# Patient Record
Sex: Female | Born: 1970 | Race: Black or African American | Hispanic: No | Marital: Single | Smoking: Never smoker
Health system: Southern US, Community
[De-identification: ages and names within clinical notes are randomized; demographics above are authoritative.]

## PROBLEM LIST (undated history)

## (undated) DIAGNOSIS — D649 Anemia, unspecified: Secondary | ICD-10-CM

## (undated) DIAGNOSIS — E785 Hyperlipidemia, unspecified: Secondary | ICD-10-CM

## (undated) DIAGNOSIS — K219 Gastro-esophageal reflux disease without esophagitis: Secondary | ICD-10-CM

## (undated) DIAGNOSIS — I10 Essential (primary) hypertension: Secondary | ICD-10-CM

## (undated) DIAGNOSIS — R000 Tachycardia, unspecified: Secondary | ICD-10-CM

## (undated) DIAGNOSIS — F32A Depression, unspecified: Secondary | ICD-10-CM

## (undated) DIAGNOSIS — F419 Anxiety disorder, unspecified: Secondary | ICD-10-CM

## (undated) DIAGNOSIS — K589 Irritable bowel syndrome without diarrhea: Secondary | ICD-10-CM

## (undated) DIAGNOSIS — F329 Major depressive disorder, single episode, unspecified: Secondary | ICD-10-CM

## (undated) HISTORY — DX: Anxiety disorder, unspecified: F41.9

## (undated) HISTORY — PX: UPPER GASTROINTESTINAL ENDOSCOPY: SHX188

## (undated) HISTORY — DX: Hyperlipidemia, unspecified: E78.5

## (undated) HISTORY — DX: Essential (primary) hypertension: I10

## (undated) HISTORY — PX: BREAST LUMPECTOMY: SHX2

## (undated) HISTORY — PX: COLONOSCOPY: SHX174

## (undated) HISTORY — PX: BUNIONECTOMY: SHX129

## (undated) HISTORY — DX: Anemia, unspecified: D64.9

## (undated) HISTORY — DX: Irritable bowel syndrome, unspecified: K58.9

## (undated) HISTORY — DX: Gastro-esophageal reflux disease without esophagitis: K21.9

## (undated) HISTORY — DX: Tachycardia, unspecified: R00.0

## (undated) HISTORY — DX: Depression, unspecified: F32.A

---
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
## 2011-11-17 ENCOUNTER — Ambulatory Visit: Payer: Self-pay | Admitting: General Practice

## 2017-08-15 ENCOUNTER — Encounter: Payer: Self-pay | Admitting: Gastroenterology

## 2017-08-22 ENCOUNTER — Ambulatory Visit: Payer: Self-pay | Admitting: Gastroenterology

## 2017-09-01 ENCOUNTER — Ambulatory Visit: Payer: Self-pay | Admitting: Gastroenterology

## 2017-10-19 ENCOUNTER — Encounter (INDEPENDENT_AMBULATORY_CARE_PROVIDER_SITE_OTHER): Payer: Self-pay

## 2017-10-19 ENCOUNTER — Ambulatory Visit (INDEPENDENT_AMBULATORY_CARE_PROVIDER_SITE_OTHER): Payer: Self-pay | Admitting: Gastroenterology

## 2017-10-19 ENCOUNTER — Encounter: Payer: Self-pay | Admitting: Gastroenterology

## 2017-10-19 VITALS — BP 134/98 | HR 78 | Ht 67.5 in | Wt 183.4 lb

## 2017-10-19 DIAGNOSIS — K529 Noninfective gastroenteritis and colitis, unspecified: Secondary | ICD-10-CM

## 2017-10-19 DIAGNOSIS — R1084 Generalized abdominal pain: Secondary | ICD-10-CM

## 2017-10-19 MED ORDER — CHOLESTYRAMINE LIGHT 4 G PO PACK: 4.0000 g | PACK | Freq: Two times a day (BID) | ORAL | 2 refills | Status: DC

## 2017-10-19 NOTE — Progress Notes (Addendum)
[facility] Consult Note:  History: Heather Marks 10/19/2017  Referring physician: Phylliss Bob, MD  Reason for consult/chief complaint: Abdominal Pain (generalized pain x 1 year.  Off/on diarrhea x one year.  Had colonoscopy  2018)   Subjective  HPI:  This is a 47 year old woman self-referred for another opinion on chronic digestive symptoms.  She reports that over a year ago she developed generalized bloated and crampy abdominal pain along with diarrhea.  She will have anywhere from 4-9 BMs per day with some urgency.  Some days she feels well with no abdominal pain or diarrhea, but she feels sick more days than not.  The stool is soft to loose, nonbloody and the pain is generalized with no clear triggers or relieving factors.  She saw Dr.Pandya in [place], and we do not have the records today.  We had made a request for them and I will review them when available. Tyreanna recalls having had a colonoscopy, but does not know of any biopsies were taken.  She recalls a stool study for fecal fat and possibly celiac testing.  She believes she was tested for H. pylori, and may have had a HIDA scan going further back.  She did not have improvement with Bentyl or much improvement with Viberzi 100 mg daily.  She does notice some worsening of symptoms with stress, but that has not been a consistent trigger. Annelise denies nausea, vomiting, early satiety, dysphagia, and recently has about a 10 pound purposeful weight loss.  ROS:  Review of Systems  Constitutional: Negative for appetite change and unexpected weight change.  HENT: Negative for mouth sores and voice change.   Eyes: Negative for pain and redness.  Respiratory: Negative for cough and shortness of breath.   Cardiovascular: Negative for chest pain and palpitations.  Genitourinary: Negative for dysuria and hematuria.  Musculoskeletal: Negative for arthralgias and myalgias.  Skin: Negative for pallor and rash.    Neurological: Negative for weakness and headaches.  Hematological: Negative for adenopathy.   She denies any history of mood disorder or history of abuse  Past Medical History: Past Medical History:  Diagnosis Date  . Anemia   . GERD (gastroesophageal reflux disease)   . Hypertension   . IBS (irritable bowel syndrome)   . Tachycardia      Past Surgical History: Past Surgical History:  Procedure Laterality Date  . BREAST LUMPECTOMY Bilateral   . BUNIONECTOMY Bilateral      Family History: Family History  Problem Relation Age of Onset  . Diabetes Mother   . Hypertension Mother   . Thyroid disease Father   . Other Maternal Grandmother        sepsis  . Other Maternal Grandfather        gun shot wound  . Heart disease Paternal Grandfather   . Colon cancer Neg Hx   . Liver cancer Neg Hx     Social History: Social History   Socioeconomic History  . Marital status: Single    Spouse name: None  . Number of children: 1  . Years of education: None  . Highest education level: None  Social Needs  . Financial resource strain: None  . Food insecurity - worry: None  . Food insecurity - inability: None  . Transportation needs - medical: None  . Transportation needs - non-medical: None  Occupational History  . Occupation: Charity fundraiser    Comment: [employer]  Tobacco Use  . Smoking status: Never Smoker  .  Smokeless tobacco: Never Used  Substance and Sexual Activity  . Alcohol use: Yes    Comment: socially  . Drug use: No  . Sexual activity: None  Other Topics Concern  . None  Social History Narrative  . None   Nurse at a mental health facility  Allergies: Allergies  Allergen Reactions  . Macrodantin [Nitrofurantoin] Swelling    Swelling of lips    Outpatient Meds: Current Outpatient Medications  Medication Sig Dispense Refill  . amLODipine (NORVASC) 10 MG tablet Take 10 mg by mouth daily.    . Vitamin D, Ergocalciferol, (DRISDOL) 50000 units CAPS  capsule Take 50,000 Units by mouth every 7 (seven) days.    . cholestyramine light (PREVALITE) 4 g packet Take 1 packet (4 g total) by mouth 2 (two) times daily. Take this medicine at least 2 hours away from any other medicines 60 packet 2   No current facility-administered medications for this visit.       ___________________________________________________________________ Objective   Exam:  BP (!) 134/98   Pulse 78   Ht 5' 7.5" (1.715 m)   Wt 183 lb 6 oz (83.2 kg)   BMI 28.30 kg/m    General: this is a(n) well-appearing woman  Eyes: sclera anicteric, no redness  ENT: oral mucosa moist without lesions, no cervical or supraclavicular lymphadenopathy, good dentition  CV: RRR without murmur, S1/S2, no JVD, no peripheral edema  Resp: clear to auscultation bilaterally, normal RR and effort noted  GI: soft, no tenderness, with active bowel sounds. No guarding or palpable organomegaly noted.  Skin; warm and dry, no rash or jaundice noted  Neuro: awake, alert and oriented x 3. Normal gross motor function and fluent speech  Labs: No data for review today   Assessment: Encounter Diagnoses  Name Primary?  . Generalized abdominal pain Yes  . Chronic diarrhea     Her symptoms do sound typical for IBS.  I would like to get records to see what celiac testing was done, and if colon biopsies were taken to rule out microscopic colitis.  She does not appear to have risk factors for chronic pancreatitis, and has not had unexpected weight loss.  He does not have risk factors for bacterial overgrowth.  She still has her gallbladder, but many patients with IBS have bile acid diarrhea.  So I have given her a trial of cholestyramine 4 g twice daily to be taken 2 hours away from other medicines.  We will make an attempt to gather her records and review them when available and contact her with a further plan for any other testing and follow-up.   Thank you for the courtesy of this consult.   Please call me with any questions or concerns.  Charlie PitterHenry L Danis III  CC: Phylliss BobZakhary, Bosh, MD   Addendum 01/05/18:  [facility]  Few office notes back to 1027 Tried bentyl, omeprazole, rifaximin (11/2016) C diff and fecal elastase ordered, no result in records. Viberzi helped diarrhea but caused stomach pain Colonoscopy 12/2016 reportedly to TI (but then no description of TI).  Single cecal Bx neg Sigmoidoscopy reportedly done 2003 (no report included), EGD 2001 (no report included) No celiac testing found  - HD

## 2017-10-19 NOTE — Patient Instructions (Signed)
If you are age 46 or older, your body mass index should be between 23-30. Your Body mass index is 28.3 kg/m. If this is out of the aforementioned range listed, please consider follow up with your Primary Care Provider.  If you are age 46 or younger, your body mass index should be between 19-25. Your Body mass index is 28.3 kg/m. If this is out of the aformentioned range listed, please consider follow up with your Primary Care Provider.   We will call you after we get records. If you don't hear from us in a week or so please stop by Dr Eliane DecreePatel's office and collect your records. 3  Thank you for choosing [facility]  Dr Amada JupiterHenry Danis III

## 2017-12-25 ENCOUNTER — Telehealth: Payer: Self-pay | Admitting: Gastroenterology

## 2017-12-25 NOTE — Telephone Encounter (Signed)
Left a detailed message that we have not recieved the records yet from Dr Allena Katz.

## 2018-01-05 ENCOUNTER — Telehealth: Payer: Self-pay | Admitting: Gastroenterology

## 2018-01-05 NOTE — Telephone Encounter (Signed)
Records finally received from [facility] and reviewed. They were not as informative as I would have liked.   Continue the cholestyramine if it is helping , and arrange clinic visit with me.

## 2018-01-08 NOTE — Telephone Encounter (Signed)
Patient called back, let her know that Dr. Myrtie Marks would like to schedule an office follow up visit. Patient was unable to do this today and would call back when she had her calendar.

## 2018-01-08 NOTE — Telephone Encounter (Signed)
Left message for patient to call back. She will need to have a follow up clinic visit.

## 2018-03-20 ENCOUNTER — Ambulatory Visit: Payer: BLUE CROSS/BLUE SHIELD | Admitting: Gastroenterology

## 2018-03-20 ENCOUNTER — Encounter (INDEPENDENT_AMBULATORY_CARE_PROVIDER_SITE_OTHER): Payer: Self-pay

## 2018-03-20 ENCOUNTER — Encounter: Payer: Self-pay | Admitting: Gastroenterology

## 2018-03-20 VITALS — BP 112/68 | HR 74 | Ht 67.0 in | Wt 188.1 lb

## 2018-03-20 DIAGNOSIS — K529 Noninfective gastroenteritis and colitis, unspecified: Secondary | ICD-10-CM | POA: Diagnosis not present

## 2018-03-20 DIAGNOSIS — R1084 Generalized abdominal pain: Secondary | ICD-10-CM | POA: Diagnosis not present

## 2018-03-20 NOTE — Patient Instructions (Addendum)
If you are age 46 or older, your body mass index should be between 23-30. Your Body mass index is 29.46 kg/m. If this is out of the aforementioned range listed, please consider follow up with your Primary Care Provider.  If you are age 46 or younger, your body mass index should be between 19-25. Your Body mass index is 29.46 kg/m. If this is out of the aformentioned range listed, please consider follow up with your Primary Care Provider.     Food Guidelines for a sensitive stomach  Many people have difficulty digesting certain foods, causing a variety of distressing and embarrassing symptoms such as abdominal pain, bloating and gas.  These foods may need to be avoided or consumed in small amounts.  Here are some tips that might be helpful for you.  1.   Lactose intolerance is the difficulty or complete inability to digest lactose, the natural sugar in milk and anything made from milk.  This condition is harmless, common, and can begin any time during life.  Some people can digest a modest amount of lactose while others cannot tolerate any.  Also, not all dairy products contain equal amounts of lactose.  For example, hard cheeses such as parmesan have less lactose than soft cheeses such as cheddar.  Yogurt has less lactose than milk or cheese.  Many packaged foods (even many brands of bread) have milk, so read ingredient lists carefully.  It is difficult to test for lactose intolerance, so just try avoiding lactose as much as possible for a week and see what happens with your symptoms.  If you seem to be lactose intolerant, the best plan is to avoid it (but make sure you get calcium from another source).  The next best thing is to use lactase enzyme supplements, available over the counter everywhere.  Just know that many lactose intolerant people need to take several tablets with each serving of dairy to avoid symptoms.  Lastly, a lot of restaurant food is made with milk or butter.  Many are things you  might not suspect, such as mashed potatoes, rice and pasta (cooked with butter) and "grilled" items.  If you are lactose intolerant, it never hurts to ask your server what has milk or butter.  2.   Fiber is an important part of your diet, but not all fiber is well-tolerated.  Insoluble fiber such as bran is often consumed by normal gut bacteria and converted into gas.  Soluble fiber such as oats, squash, carrots and green beans are typically tolerated better.  3.   Some types of carbohydrates can be poorly digested.  Examples include: fructose (apples, cherries, pears, raisins and other dried fruits), fructans (onions, zucchini, large amounts of wheat), sorbitol/mannitol/xylitol and sucralose/Splenda (common artificial sweeteners), and raffinose (lentils, broccoli, cabbage, asparagus, brussel sprouts, many types of beans).  Do a Programmer, multimediaweb search for National CityFODMAP diet and you will find helpful information. Beano, a dietary supplement, will often help with raffinose-containing foods.  As with lactase tablets, you may need several per serving.  4.   Whenever possible, avoid processed food&meats and chemical additives.  High fructose corn syrup, a common sweetener, may be difficult to digest.  Eggs and soy (comes from the soybean, and added to many foods now) are other common bloating/gassy foods.  - Dr. Sherlynn CarbonHenry Danis [facility]    ___________________________________________________________________   Please call us soon with the dose of the rifaximin you have at home so we can give recommendation on dosing.

## 2018-03-20 NOTE — Progress Notes (Signed)
[facility] Progress Note  Chief Complaint: Chronic abdominal pain and diarrhea  Subjective  History:  This is a 47 year old woman I saw when she self-referred for another opinion on chronic abdominal pain and diarrhea in March of this year.  She had previously spent seen by [place]and[place] in [place].  Records were received and reviewed after that office note and were put as an addendum to the note of the March 2019 office visit.  This is my own summary of those records: Few office notes back to 1027 Tried bentyl, omeprazole, rifaximin (11/2016) C diff and fecal elastase ordered, no result in records. Viberzi helped diarrhea but caused stomach pain Colonoscopy 12/2016 reportedly to TI (but then no description of TI).  Single cecal Bx neg Sigmoidoscopy reportedly done 2003 (no report included), EGD 2001 (no report included) No celiac testing found  Latrish reports that the cholestyramine decrease his stool frequency from 5 or 6 a day to 1 or 2, and they are usually soft.  There is no rectal bleeding.  If the diarrhea is decreased, so too is the abdominal pain.  Her symptoms are always worse around her menstrual cycle.  Stress is also a trigger.  She still has bloating and gas with audible rumbling that is not much improved.  She finds the cholestyramine powder difficult to take.  ROS: Cardiovascular:  no chest pain Respiratory: no dyspnea Weight stable The patient's Past Medical, Family and Social History were reviewed and are on file in the EMR.  Objective:  Med list reviewed  Current Outpatient Medications:  .  amLODipine (NORVASC) 10 MG tablet, Take 10 mg by mouth daily., Disp: , Rfl:  .  cholestyramine light (PREVALITE) 4 g packet, Take 1 packet (4 g total) by mouth 2 (two) times daily. Take this medicine at least 2 hours away from any other medicines, Disp: 60 packet, Rfl: 2 .  Vitamin D, Ergocalciferol, (DRISDOL) 50000 units CAPS capsule, Take 50,000 Units by mouth every 7  (seven) days., Disp: , Rfl:    Vital signs in last 24 hrs: Vitals:   03/20/18 0949  BP: 112/68  Pulse: 74    Physical Exam  Well-appearing  HEENT: sclera anicteric, oral mucosa moist without lesions  Neck: supple, no thyromegaly, JVD or lymphadenopathy  Cardiac: RRR without murmurs, S1S2 heard, no peripheral edema  Pulm: clear to auscultation bilaterally, normal RR and effort noted  Abdomen: soft, no tenderness, with active bowel sounds. No guarding or palpable hepatosplenomegaly.  Skin; warm and dry, no jaundice or rash  Previous records as summarized  @ASSESSMENTPLANBEGIN @ Assessment: Encounter Diagnoses  Name Primary?  . Generalized abdominal pain Yes  . Chronic diarrhea     I suspect she has IBS, no risk factors for pancreatic insufficiency, less likely bacterial overgrowth.  She reports that in fact she was prescribed rifaximin, and even filled the prescription, but was then told not to take it.  I think an empiric trial of rifaximin is reasonable.  She has 42 tablets of the 550 mg dose.  I advised one tablet twice daily x 14 days.  I would like to then hear back from her on whether or not it was helpful for relief of symptoms.  If not, then I will change the cholestyramine powder to a similar oral medicine such as colestipol or colesevalam.  Written dietary advice regarding bloating and gas were also given.  Total time 20 minutes, over half spent face-to-face with patient in counseling and coordination of care.  Nelida Meuse III

## 2018-03-24 ENCOUNTER — Other Ambulatory Visit: Payer: Self-pay | Admitting: Gastroenterology

## 2018-04-24 ENCOUNTER — Other Ambulatory Visit: Payer: Self-pay | Admitting: Gastroenterology

## 2018-10-24 ENCOUNTER — Telehealth: Payer: Self-pay

## 2018-10-24 NOTE — Telephone Encounter (Signed)
Covid-19 travel screening questions  Have you traveled in the last 14 days? If yes where?  Do you now or have you had a fever in the last 14 days?  Do you have any respiratory symptoms of shortness of breath or cough now or in the last 14 days?  Do you have a medical history of Congestive Heart Failure? N/A  Do you have a medical history of lung disease? N/A  Do you have any family members or close contacts with diagnosed or suspected Covid-19?  Left message for pt to call and reschedule her appt if she answers yes to any of the above questions.

## 2018-10-25 ENCOUNTER — Ambulatory Visit: Payer: BLUE CROSS/BLUE SHIELD | Admitting: Gastroenterology

## 2018-10-25 ENCOUNTER — Other Ambulatory Visit: Payer: Self-pay

## 2018-10-25 ENCOUNTER — Encounter: Payer: Self-pay | Admitting: Gastroenterology

## 2018-10-25 VITALS — BP 112/80 | HR 58 | Temp 97.7°F | Ht 67.0 in | Wt 189.0 lb

## 2018-10-25 DIAGNOSIS — R140 Abdominal distension (gaseous): Secondary | ICD-10-CM | POA: Diagnosis not present

## 2018-10-25 DIAGNOSIS — K529 Noninfective gastroenteritis and colitis, unspecified: Secondary | ICD-10-CM | POA: Diagnosis not present

## 2018-10-25 DIAGNOSIS — R143 Flatulence: Secondary | ICD-10-CM

## 2018-10-25 DIAGNOSIS — R1010 Upper abdominal pain, unspecified: Secondary | ICD-10-CM

## 2018-10-25 MED ORDER — COLESEVELAM HCL 625 MG PO TABS: 625.0000 mg | ORAL_TABLET | Freq: Two times a day (BID) | ORAL | 0 refills | Status: DC

## 2018-10-25 NOTE — Progress Notes (Signed)
Heather Marks Progress Note  Chief Complaint: Upper abdominal pain  Subjective  History: Seen twice last years another opinion, self-referred after prior care in [place].  Extensive work-up over many years for chronic abdominal pain, diarrhea, bloating and gas.  That is summarized in the previous note. No improvement with omeprazole, bentyl, Viberzi.  She also had reported collections of gallbladder testing, no documentation of that in the available [facility] records.  At last visit here August 2019, underwent empiric trial of rifaximin for SIBO.  She had previously had improvement on cholestyramine, but found the powder difficult to take.  We did not hear back from her after that appointment with an update on symptoms.  She tells me today that there was no improvement in the bloating and gas or diarrhea with the trial of rifaximin.  Heather Marks reports that in the last several months she has had frequent episodes of upper abdominal pain that starts in the midline and radiates in a bandlike fashion and around to the back.  It is difficult to tell if there have been clear or consistent triggers, but it seems most often to happen in the evening, and she is taken to sitting up partially for sleep.  The episodes last at least 5 to 10 minutes, and she has been taking liquid ibuprofen for relief.  Sometimes the pain radiates down to the lower abdomen.  She is also having intermittent pyrosis for which she is on OTC omeprazole.  She denies dysphagia, odynophagia, nausea, vomiting or weight loss. The cholestyramine helped decrease frequency of BMs, she is usually down to 2/day but they are semi-formed.  She has not taken the cholestyramine all that often because she finds it cumbersome.  ROS: Cardiovascular:  no chest pain Respiratory: no dyspnea Remainder of systems negative except as above  The patient's Past Medical, Family and Social History were reviewed and are on file in the EMR.  Objective:   Med list reviewed  Current Outpatient Medications:  .  amLODipine (NORVASC) 10 MG tablet, Take 10 mg by mouth daily., Disp: , Rfl:  .  omeprazole (PRILOSEC) 20 MG capsule, Take 20 mg by mouth daily., Disp: , Rfl:  .  PREVALITE 4 g packet, MIX 1 PACKET WITH LIQUID AND DRINK TWICE A DAY AT LEAST 2 HRS BEFORE OR AFTER ANY OTHER MEDICINES, Disp: 60 packet, Rfl: 2 .  colesevelam (WELCHOL) 625 MG tablet, Take 1 tablet (625 mg total) by mouth 2 (two) times daily with a meal for 14 days., Disp: 28 tablet, Rfl: 0   Vital signs in last 24 hrs: Vitals:   10/25/18 0846  BP: 112/80  Pulse: (!) 58  Temp: 97.7 F (36.5 C)    Physical Exam   HEENT: sclera anicteric, oral mucosa moist without lesions  Neck: supple, no thyromegaly, JVD or lymphadenopathy  Cardiac: RRR without murmurs, S1S2 heard, no peripheral edema  Pulm: clear to auscultation bilaterally, normal RR and effort noted  Abdomen: soft, no tenderness, with active bowel sounds. No guarding or palpable hepatosplenomegaly.  Skin; warm and dry, no jaundice or rash     @ASSESSMENTPLANBEGIN @ Assessment: Encounter Diagnoses  Name Primary?  Marland Kitchen Upper abdominal pain Yes  . Chronic diarrhea   . Abdominal bloating   . Flatulence     Upper abdominal pain, possibly biliary colic.  It is difficult to tell if it is similar to pain that she has previously had requiring further work-up in [place].  Even if so, it has been escalating  in recent months.  Plan: Right upper quadrant ultrasound Change cholestyramine to WelChol 625 mg 1-2 times daily for suspected bile acid diarrhea.   Total time 25 minutes, over half spent face-to-face with patient in counseling and coordination of care.   Charlie Pitter III

## 2018-10-25 NOTE — Patient Instructions (Addendum)
If you are age 47 or older, your body mass index should be between 23-30. Your Body mass index is 29.6 kg/m. If this is out of the aforementioned range listed, please consider follow up with your Primary Care Provider.  If you are age 66 or younger, your body mass index should be between 19-25. Your Body mass index is 29.6 kg/m. If this is out of the aformentioned range listed, please consider follow up with your Primary Care Provider.   You have been scheduled for an abdominal ultrasound at [facility] (1st floor of hospital) on 11-22-2018 at 10:30am. Please arrive 15 minutes prior to your appointment for registration. Make certain not to have anything to eat or drink 6 hours prior to your appointment. Should you need to reschedule your appointment, please contact radiology at [phone]. This test typically takes about 30 minutes to perform.  To help prevent the possible spread of infection to our patients, communities, and staff; we will be implementing the following measures:  Please only allow one visitor/family member to accompany you to any upcoming appointments with [facility]. If you have any concerns about this please contact our office to discuss prior to the appointment.   It was a pleasure to see you today!  Dr. Myrtie Neither

## 2018-11-22 ENCOUNTER — Ambulatory Visit (HOSPITAL_COMMUNITY): Admission: RE | Admit: 2018-11-22 | Payer: BLUE CROSS/BLUE SHIELD | Source: Ambulatory Visit

## 2018-11-22 ENCOUNTER — Other Ambulatory Visit: Payer: Self-pay | Admitting: Gastroenterology

## 2018-11-23 MED ORDER — CHOLESTYRAMINE LIGHT 4 G PO PACK: PACK | ORAL | 2 refills | Status: DC

## 2018-11-23 NOTE — Telephone Encounter (Signed)
Refilled her cholestyramine light as requested.

## 2018-11-27 ENCOUNTER — Other Ambulatory Visit: Payer: Self-pay | Admitting: Gastroenterology

## 2018-12-23 ENCOUNTER — Other Ambulatory Visit: Payer: Self-pay | Admitting: Gastroenterology

## 2019-01-15 ENCOUNTER — Other Ambulatory Visit: Payer: Self-pay | Admitting: Gastroenterology

## 2019-04-14 ENCOUNTER — Other Ambulatory Visit: Payer: Self-pay | Admitting: Gastroenterology

## 2019-09-10 ENCOUNTER — Other Ambulatory Visit: Payer: Self-pay | Admitting: Gastroenterology

## 2019-09-10 NOTE — Telephone Encounter (Signed)
I refilled this patient's Welchol.  Last seen March 2020  Please arrange a telemedicine visit for sometime next month (lives in [place], [place])

## 2019-09-11 NOTE — Telephone Encounter (Signed)
Left a detailed office visit to call and schedule a yearly follow up. Can be virtual per Dr Myrtie Neither as she live in [place].

## 2019-09-11 NOTE — Telephone Encounter (Signed)
Follow up 10-08-2019

## 2019-09-27 ENCOUNTER — Telehealth: Payer: Self-pay | Admitting: Gastroenterology

## 2019-09-27 NOTE — Telephone Encounter (Signed)
I spoke with the pt and she tells me that she continues to have diarrhea.  She has an appt on 3/2 but would like advice to make it thru the weekend. She tells me that she has tried imodium and that helps but was not sure if she could take it long term.  I advised that she can take the imodium until her appt on Tuesday and Dr Myrtie Neither would discuss further. The pt has been advised of the information and verbalized understanding.

## 2019-09-27 NOTE — Telephone Encounter (Signed)
Pt reported that she has been experiencing diarrhea and would like to know what she can do until her appt 10/08/19.

## 2019-10-08 ENCOUNTER — Other Ambulatory Visit: Payer: Self-pay

## 2019-10-08 ENCOUNTER — Encounter: Payer: Self-pay | Admitting: Gastroenterology

## 2019-10-08 ENCOUNTER — Ambulatory Visit (INDEPENDENT_AMBULATORY_CARE_PROVIDER_SITE_OTHER): Payer: Self-pay | Admitting: Gastroenterology

## 2019-10-08 VITALS — BP 130/70 | HR 76 | Temp 98.8°F | Ht 67.0 in | Wt 195.0 lb

## 2019-10-08 DIAGNOSIS — R140 Abdominal distension (gaseous): Secondary | ICD-10-CM

## 2019-10-08 DIAGNOSIS — K529 Noninfective gastroenteritis and colitis, unspecified: Secondary | ICD-10-CM

## 2019-10-08 DIAGNOSIS — R1013 Epigastric pain: Secondary | ICD-10-CM

## 2019-10-08 NOTE — Progress Notes (Addendum)
[facility] Progress Note  Chief Complaint: Chronic diarrhea  Subjective  History: Years of chronic abdominal pain, more so in the upper abdomen.  Extensive work-up in [place] prior to seeing me in 2019.  Colonoscopy to the cecum (in [facility] May 2018 by Dr. Allena Katz) reportedly normal (report on file)-biopsies negative for microscopic colitis.  Last office visit March 2020, pain seem to be getting more frequent, so my plan was to schedule an ultrasound.  She does not appear to have had that done, probably due to Covid restrictions at that time. Intermittent diarrhea, multiple previous meds had not helped (outlined in my last office note).  Questran would seem to help at times, but she found it difficult to use.  At last visit I prescribed a trial of WelChol to see if that was easier.  Previous trial of rifaximin did not seem to help.  She called the office recently complaining of more diarrhea.  Bothered by generalized abdominal bloating and lots of "noise".  It seems like things are "angry and active".  She still at times has more focal epigastric pain that tends to occur in the evening.  Rare nausea, no vomiting.  Appetite generally good, weight stable.  Stool loose most days, Questran twice daily helps, Welchol not as helpful.   ROS: Cardiovascular:  no chest pain Respiratory: no dyspnea  The patient's Past Medical, Family and Social History were reviewed and are on file in the EMR.  Objective:  Med list reviewed  Current Outpatient Medications:  .  amLODipine (NORVASC) 10 MG tablet, Take 10 mg by mouth daily., Disp: , Rfl:  .  cholestyramine light (PREVALITE) 4 g packet, MIX 1 PACKET WITH LIQUID AND DRINK TWICE A DAY AT LEAST 2 HRS BEFORE OR AFTER ANY OTHER MEDICINES, Disp: 60 packet, Rfl: 2 .  colesevelam (WELCHOL) 625 MG tablet, TAKE 1 TABLET BY MOUTH TWICE A DAY WITH MEALS, Disp: 90 tablet, Rfl: 0 .  omeprazole (PRILOSEC) 20 MG capsule, Take 20 mg by mouth daily., Disp: ,  Rfl:  .  Probiotic Product (PROBIOTIC PO), Take 1 capsule by mouth daily. [employer] brand, Disp: , Rfl:  .  sertraline (ZOLOFT) 25 MG tablet, Take 25 mg by mouth daily., Disp: , Rfl:    Vital signs in last 24 hrs: Vitals:   10/08/19 1503  BP: 130/70  Pulse: 76  Temp: 98.8 F (37.1 C)    Physical Exam  Well-appearing  HEENT: sclera anicteric, oral mucosa moist without lesions  Neck: supple, no thyromegaly, JVD or lymphadenopathy  Cardiac: RRR without murmurs, S1S2 heard, no peripheral edema  Pulm: clear to auscultation bilaterally, normal RR and effort noted  Abdomen: soft, no tenderness, with active bowel sounds. No guarding or palpable hepatosplenomegaly.  Skin; warm and dry, no jaundice or rash  Labs:   ___________________________________________ Radiologic studies:   ____________________________________________ Other:   _____________________________________________ Assessment & Plan  Assessment: Encounter Diagnoses  Name Primary?  . Epigastric pain Yes  . Abdominal bloating   . Chronic diarrhea     Overall, symptoms still seem most consistent with functional bowel disorder.  Consider SIBO, though no previous improvement with rifaximin that she recalled. More focal upper abdominal pain at times, raises suspicion for biliary colic and/or peptic ulcer disease.  Plan: No change in medicines at this point. Right upper quadrant ultrasound Upper endoscopy.  She is agreeable after discussion of procedure and risks.  The benefits and risks of the planned procedure were described in detail with  the patient or (when appropriate) their health care proxy.  Risks were outlined as including, but not limited to, bleeding, infection, perforation, adverse medication reaction leading to cardiac or pulmonary decompensation, pancreatitis (if ERCP).  The limitation of incomplete mucosal visualization was also discussed.  No guarantees or warranties were given.  Lactulose  breath test  If all unrevealing, consider other treatment for functional bowel disorder such as TCA or buspirone.   30 minutes were spent on this encounter (including chart review, history/exam, counseling/coordination of care, and documentation)  Nelida Meuse III

## 2019-10-08 NOTE — Patient Instructions (Signed)
If you are age 48 or older, your body mass index should be between 23-30. Your Body mass index is 30.54 kg/m. If this is out of the aforementioned range listed, please consider follow up with your Primary Care Provider.  If you are age 14 or younger, your body mass index should be between 19-25. Your Body mass index is 30.54 kg/m. If this is out of the aformentioned range listed, please consider follow up with your Primary Care Provider.   You have been scheduled for an abdominal ultrasound at [facility] (1st floor of hospital) on Monday 10/14/19 at 9:30 am. Please arrive 15 minutes prior to your appointment for registration. Make certain not to have anything to eat or drink 6 hours prior to your appointment. Should you need to reschedule your appointment, please contact radiology at [phone]. This test typically takes about 30 minutes to perform.  You have been given a testing kit to check for small intestine bacterial overgrowth (SIBO) which is completed by a company named [employer]. Make sure to return your test in the mail using the return mailing label given you along with the kit. Your demographic and insurance information have already been sent to the company and they should be in contact with you over the next week regarding this test. Please keep in mind that you will be getting a call from phone number [phone] or a similar number. If you do not hear from them within this time frame, please call our office at [phone].   You have been scheduled for an endoscopy. Please follow written instructions given to you at your visit today. If you use inhalers (even only as needed), please bring them with you on the day of your procedure.  It was a pleasure to see you today!  Dr. Loletha Carrow

## 2019-10-14 ENCOUNTER — Ambulatory Visit (HOSPITAL_COMMUNITY)
Admission: RE | Admit: 2019-10-14 | Discharge: 2019-10-14 | Disposition: A | Discharge status: Home / Self Care | Payer: PRIVATE HEALTH INSURANCE | Source: Ambulatory Visit | Attending: Gastroenterology | Admitting: Gastroenterology

## 2019-10-14 ENCOUNTER — Other Ambulatory Visit: Payer: Self-pay

## 2019-10-14 DIAGNOSIS — K529 Noninfective gastroenteritis and colitis, unspecified: Secondary | ICD-10-CM | POA: Diagnosis present

## 2019-10-14 DIAGNOSIS — R140 Abdominal distension (gaseous): Secondary | ICD-10-CM | POA: Insufficient documentation

## 2019-10-14 DIAGNOSIS — R1013 Epigastric pain: Secondary | ICD-10-CM | POA: Diagnosis not present

## 2019-10-18 ENCOUNTER — Other Ambulatory Visit: Payer: Self-pay | Admitting: Gastroenterology

## 2019-10-21 ENCOUNTER — Other Ambulatory Visit: Payer: Self-pay

## 2019-10-21 ENCOUNTER — Other Ambulatory Visit (HOSPITAL_COMMUNITY)
Admission: RE | Admit: 2019-10-21 | Discharge: 2019-10-21 | Disposition: A | Discharge status: Home / Self Care | Payer: PRIVATE HEALTH INSURANCE | Source: Ambulatory Visit | Attending: Gastroenterology | Admitting: Gastroenterology

## 2019-10-21 DIAGNOSIS — Z20822 Contact with and (suspected) exposure to covid-19: Secondary | ICD-10-CM | POA: Diagnosis not present

## 2019-10-21 DIAGNOSIS — Z01812 Encounter for preprocedural laboratory examination: Secondary | ICD-10-CM | POA: Diagnosis not present

## 2019-10-22 LAB — SARS CORONAVIRUS 2 (TAT 6-24 HRS): SARS Coronavirus 2: NEGATIVE

## 2019-10-23 ENCOUNTER — Encounter: Payer: Self-pay | Admitting: Gastroenterology

## 2019-10-23 ENCOUNTER — Other Ambulatory Visit: Payer: Self-pay

## 2019-10-23 ENCOUNTER — Ambulatory Visit (AMBULATORY_SURGERY_CENTER): Payer: PRIVATE HEALTH INSURANCE | Admitting: Gastroenterology

## 2019-10-23 VITALS — BP 123/78 | HR 55 | Temp 97.5°F | Resp 14 | Ht 67.0 in | Wt 195.0 lb

## 2019-10-23 DIAGNOSIS — K2950 Unspecified chronic gastritis without bleeding: Secondary | ICD-10-CM | POA: Diagnosis not present

## 2019-10-23 DIAGNOSIS — R140 Abdominal distension (gaseous): Secondary | ICD-10-CM

## 2019-10-23 DIAGNOSIS — K529 Noninfective gastroenteritis and colitis, unspecified: Secondary | ICD-10-CM

## 2019-10-23 DIAGNOSIS — R1013 Epigastric pain: Secondary | ICD-10-CM

## 2019-10-23 MED ORDER — SODIUM CHLORIDE 0.9 % IV SOLN: 500.0000 mL | Freq: Once | INTRAVENOUS | Status: DC

## 2019-10-23 NOTE — Progress Notes (Signed)
Called to room to assist during endoscopic procedure.  Patient ID and intended procedure confirmed with present staff. Received instructions for my participation in the procedure from the performing physician.  

## 2019-10-23 NOTE — Patient Instructions (Signed)
The biopsies taken today have been sent for pathology.  The results can take 1-3 weeks to receive.  You may resume your previous diet and medication schedule.  Thank you for allowing [place] to care for you today!!!   YOU HAD AN ENDOSCOPIC PROCEDURE TODAY AT THE [facility]:   Refer to the procedure report that was given to you for any specific questions about what was found during the examination.  If the procedure report does not answer your questions, please call your gastroenterologist to clarify.  If you requested that your care partner not be given the details of your procedure findings, then the procedure report has been included in a sealed envelope for you to review at your convenience later.  YOU SHOULD EXPECT: Some feelings of bloating in the abdomen. Passage of more air (belching) than usual.    Please Note:  You might notice some irritation and congestion in your nose or some drainage.  This is from the oxygen used during your procedure.  There is no need for concern and it should clear up in a day or so.  SYMPTOMS TO REPORT IMMEDIATELY:   Following upper endoscopy (EGD)  Vomiting of blood or coffee ground material  New chest pain or pain under the shoulder blades  Painful or persistently difficult swallowing  New shortness of breath  Fever of 100F or higher  Black, tarry-looking stools  For urgent or emergent issues, a gastroenterologist can be reached at any hour by calling [phone]. Do not use MyChart messaging for urgent concerns.    DIET:  We do recommend a small meal at first, but then you may proceed to your regular diet.  Drink plenty of fluids but you should avoid alcoholic beverages for 24 hours.  ACTIVITY:  You should plan to take it easy for the rest of today and you should NOT DRIVE or use heavy machinery until tomorrow (because of the sedation medicines used during the test).    FOLLOW UP: Our staff will call the number listed on your records  48-72 hours following your procedure to check on you and address any questions or concerns that you may have regarding the information given to you following your procedure. If we do not reach you, we will leave a message.  We will attempt to reach you two times.  During this call, we will ask if you have developed any symptoms of COVID 19. If you develop any symptoms (ie: fever, flu-like symptoms, shortness of breath, cough etc.) before then, please call [phone].  If you test positive for Covid 19 in the 2 weeks post procedure, please call and report this information to [place].    If any biopsies were taken you will be contacted by phone or by letter within the next 1-3 weeks.  Please call us at [phone] if you have not heard about the biopsies in 3 weeks.    SIGNATURES/CONFIDENTIALITY: You and/or your care partner have signed paperwork which will be entered into your electronic medical record.  These signatures attest to the fact that that the information above on your After Visit Summary has been reviewed and is understood.  Full responsibility of the confidentiality of this discharge information lies with you and/or your care-partner.

## 2019-10-23 NOTE — Progress Notes (Signed)
Temp-Lisa [facility] VS- [address]

## 2019-10-23 NOTE — Op Note (Signed)
[place] Patient Name: Heather Marks Procedure Date: 10/23/2019 9:52 AM MRN: [id number] Endoscopist: [place]. Loletha Carrow , MD Age: 49 Referring MD:  Date of Birth: 21-Dec-1970 Gender: Female Account #: [id number] Procedure:                Upper GI endoscopy Indications:              Epigastric abdominal pain, Diarrhea Medicines:                Monitored Anesthesia Care Procedure:                Pre-Anesthesia Assessment:                           - Prior to the procedure, a History and Physical                            was performed, and patient medications and                            allergies were reviewed. The patient's tolerance of                            previous anesthesia was also reviewed. The risks                            and benefits of the procedure and the sedation                            options and risks were discussed with the patient.                            All questions were answered, and informed consent                            was obtained. Prior Anticoagulants: The patient has                            taken no previous anticoagulant or antiplatelet                            agents. ASA Grade Assessment: II - A patient with                            mild systemic disease. After reviewing the risks                            and benefits, the patient was deemed in                            satisfactory condition to undergo the procedure.                           After obtaining informed consent, the endoscope was  passed under direct vision. Throughout the                            procedure, the patient's blood pressure, pulse, and                            oxygen saturations were monitored continuously. The                            Endoscope was introduced through the mouth, and                            advanced to the second part of duodenum. The upper                            GI endoscopy was  accomplished without difficulty.                            The patient tolerated the procedure well. Scope In: Scope Out: Findings:                 The larynx was normal.                           The esophagus was normal.                           The entire examined stomach was normal. Biopsies                            were taken with a cold forceps for histology.                            ([place] protocol).                           The cardia and gastric fundus were normal on                            retroflexion.                           The examined duodenum was normal. Biopsies for                            histology were taken with a cold forceps for                            evaluation of celiac disease. Complications:            No immediate complications. Estimated Blood Loss:     Estimated blood loss was minimal. Impression:               - Normal larynx.                           - Normal esophagus.                           -  Normal stomach. Biopsied.                           - Normal examined duodenum. Biopsied. Recommendation:           - Patient has a contact number available for                            emergencies. The signs and symptoms of potential                            delayed complications were discussed with the                            patient. Return to normal activities tomorrow.                            Written discharge instructions were provided to the                            patient.                           - Resume previous diet.                           - Continue present medications.                           - Await pathology results.                           - see recent clinic note for additional impressions Heather [facility] L. Myrtie Neither, MD 10/23/2019 10:24:55 AM This report has been signed electronically.

## 2019-10-23 NOTE — Progress Notes (Signed)
PT taken to PACU. Monitors in place. VSS. Report given to RN.  Pt complained of soreness in bottom lip. Upon inspection, a small tear was noted. Most likely related to the bite guard used during EGD.

## 2019-10-25 ENCOUNTER — Telehealth: Payer: Self-pay | Admitting: *Deleted

## 2019-10-25 ENCOUNTER — Telehealth: Payer: Self-pay

## 2019-10-25 NOTE — Telephone Encounter (Signed)
Attempted to reach pt. Following endoscopic procedure 10/23/2019.  Pt.'s voice mailbox full, unable to LM.  Will try to reach pt. Again later today.

## 2019-10-25 NOTE — Telephone Encounter (Signed)
No answer for post procedure call back. Unable to leave message for patient. 

## 2019-10-25 NOTE — Telephone Encounter (Signed)
Patient returned call she said she is well but she does have an area on her mouth that she is questioning.

## 2019-11-01 ENCOUNTER — Other Ambulatory Visit: Payer: Self-pay | Admitting: Gastroenterology

## 2019-12-03 ENCOUNTER — Encounter: Payer: Self-pay | Admitting: Gastroenterology

## 2019-12-03 ENCOUNTER — Ambulatory Visit (INDEPENDENT_AMBULATORY_CARE_PROVIDER_SITE_OTHER): Payer: PRIVATE HEALTH INSURANCE | Admitting: Gastroenterology

## 2019-12-03 VITALS — BP 120/60 | HR 65 | Temp 97.8°F | Ht 67.0 in | Wt 198.0 lb

## 2019-12-03 DIAGNOSIS — R140 Abdominal distension (gaseous): Secondary | ICD-10-CM | POA: Diagnosis not present

## 2019-12-03 DIAGNOSIS — K580 Irritable bowel syndrome with diarrhea: Secondary | ICD-10-CM | POA: Diagnosis not present

## 2019-12-03 MED ORDER — COLESEVELAM HCL 625 MG PO TABS: 1250.0000 mg | ORAL_TABLET | Freq: Two times a day (BID) | ORAL | 2 refills | Status: DC

## 2019-12-03 NOTE — Progress Notes (Signed)
[facility] Progress Note  Chief Complaint: Abdominal pain/IBS  Subjective  History: Diarrhea predominant IBS and epigastric pain.  Extensive prior work-up in [place], detailed in my previous notes, most recently early March this year.  Intermittent response to cholestyramine, little or no improvement with antispasmodic.  No improvement with previous trial of rifaximin. Recent lactulose breath test ordered, does not appear to have been done. Normal EGD on 10/23/2019 (biopsy results below)  Heather Marks has generally been well since I last saw her.  Epigastric pain has decreased.  She denies nausea vomiting or dysphagia.  Two WelChol tablets in the morning takes her through part of the day with formed stool, but then by mid-to-late afternoon she will have loose stool.  Cholestyramine powder works sometimes but she does not like to take it because the texture makes her nauseated. Denies rectal bleeding. ROS: Cardiovascular:  no chest pain Respiratory: no dyspnea Mood stable on low-dose Zoloft Remainder of systems negative except as above The patient's Past Medical, Family and Social History were reviewed and are on file in the EMR.  Objective:  Med list reviewed  Current Outpatient Medications:  .  amLODipine (NORVASC) 10 MG tablet, Take 10 mg by mouth daily., Disp: , Rfl:  .  cholestyramine light (PREVALITE) 4 g packet, MIX 1 PACKET WITH LIQUID AND DRINK TWICE A DAY AT LEAST 2 HRS BEFORE OR AFTER ANY OTHER MEDICINES (Patient taking differently: as needed. MIX 1 PACKET WITH LIQUID AND DRINK TWICE A DAY AT LEAST 2 HRS BEFORE OR AFTER ANY OTHER MEDICINES takes as needed), Disp: 60 packet, Rfl: 2 .  colesevelam (WELCHOL) 625 MG tablet, Take 2 tablets (1,250 mg total) by mouth 2 (two) times daily with a meal., Disp: 120 tablet, Rfl: 2 .  omeprazole (PRILOSEC) 20 MG capsule, Take 20 mg by mouth daily., Disp: , Rfl:  .  Probiotic Product (PROBIOTIC PO), Take 1 capsule by mouth daily.  [employer] brand, Disp: , Rfl:  .  sertraline (ZOLOFT) 25 MG tablet, Take 25 mg by mouth daily., Disp: , Rfl:    Vital signs in last 24 hrs: Vitals:   12/03/19 0837  BP: 120/60  Pulse: 65  Temp: 97.8 F (36.6 C)    Physical Exam  Well-appearing  HEENT: sclera anicteric, oral mucosa moist without lesions  Neck: supple, no thyromegaly, JVD or lymphadenopathy  Cardiac: RRR without murmurs, S1S2 heard, no peripheral edema  Pulm: clear to auscultation bilaterally, normal RR and effort noted  Abdomen: soft, no tenderness, with active bowel sounds. No guarding or palpable hepatosplenomegaly.  Skin; warm and dry, no jaundice or rash  Labs:   ___________________________________________ Radiologic studies: CLINICAL DATA:  Right upper quadrant pain, bloating, diarrhea   EXAM: ULTRASOUND ABDOMEN LIMITED RIGHT UPPER QUADRANT   COMPARISON:  None.   FINDINGS: Gallbladder:   No gallstones or wall thickening visualized. No sonographic Murphy sign noted by sonographer.   Common bile duct:   Diameter: 3.1 mm   Liver:   Minimal increased echogenicity suggesting early hepatic steatosis. No focal abnormality or intrahepatic biliary dilatation. Portal vein is patent on color Doppler imaging with normal direction of blood flow towards the liver.   Other: No free fluid or ascites   IMPRESSION: Negative for gallstones or other acute finding by ultrasound.   Suspect minor/early hepatic steatosis.     Electronically Signed   By: Jerilynn Mages.  Shick M.D.   On: 10/14/2019 10:11  ____________________________________________ Other: Gastric and duodenal biopsy results:  1. Surgical [P],  duodenum - DUODENAL MUCOSA WITH NO SPECIFIC HISTOPATHOLOGIC CHANGES - NEGATIVE FOR INCREASED INTRAEPITHELIAL LYMPHOCYTES OR VILLOUS ARCHITECTURAL CHANGES 2. Surgical [P], random gastric bx - GASTRIC ANTRAL AND OXYNTIC MUCOSA WITH MILD CHRONIC GASTRITIS - WARTHIN STARRY STAIN IS NEGATIVE FOR  HELICOBACTER PYLORI  _____________________________________________ Assessment & Plan  Assessment: Encounter Diagnoses  Name Primary?  . Irritable bowel syndrome with diarrhea Yes  . Abdominal bloating    Not clear why she was developing more focal epigastric pain recently.  She tells me it comes on when she has loose bowel movements, so I think it is part of her IBS.  No gallstones on recent ultrasound, recent normal upper endoscopy and biopsies.   Plan: Increase WelChol to 2 tablets in the morning, then 1 in the afternoon.  She will continue to be sure she takes this at least 2 hours away from other medicines. If 1 tablet in the afternoon does not give sufficient improvement in symptoms, she will increase it to 2 tablets with that dose.   Thirty minutes were spent on this encounter (including chart review, history/exam, counseling/coordination of care, and documentation)  Charlie Pitter III

## 2019-12-03 NOTE — Patient Instructions (Signed)
We have sent the following medications to your pharmacy for you to pick up at your convenience: Welchol.

## 2020-05-01 ENCOUNTER — Other Ambulatory Visit: Payer: Self-pay | Admitting: Gastroenterology

## 2020-05-15 ENCOUNTER — Telehealth: Payer: Self-pay | Admitting: Gastroenterology

## 2020-05-15 NOTE — Telephone Encounter (Signed)
Please advise, thank you.

## 2020-05-15 NOTE — Telephone Encounter (Signed)
Patient called requesting a letter for work notifying them of her diagnosis IBSD due to her work requirements she is not able to stand for hours at a time without having to go to the bathroom please advise

## 2020-05-17 NOTE — Telephone Encounter (Signed)
I composed a brief letter that I routed to you.  Please send it to her (as she does not appear to be on MyChart)

## 2020-05-18 NOTE — Telephone Encounter (Signed)
Spoke with patient, she is aware that letter will be placed in the mail today. Patient confirmed address on file.

## 2020-07-26 ENCOUNTER — Other Ambulatory Visit: Payer: Self-pay | Admitting: Gastroenterology

## 2020-08-26 ENCOUNTER — Other Ambulatory Visit: Payer: Self-pay | Admitting: Gastroenterology

## 2020-08-26 ENCOUNTER — Ambulatory Visit (INDEPENDENT_AMBULATORY_CARE_PROVIDER_SITE_OTHER): Payer: PRIVATE HEALTH INSURANCE | Admitting: Gastroenterology

## 2020-08-26 ENCOUNTER — Encounter: Payer: Self-pay | Admitting: Gastroenterology

## 2020-08-26 ENCOUNTER — Other Ambulatory Visit: Payer: PRIVATE HEALTH INSURANCE

## 2020-08-26 ENCOUNTER — Other Ambulatory Visit: Payer: Self-pay

## 2020-08-26 VITALS — BP 138/92 | HR 76 | Ht 67.0 in | Wt 197.0 lb

## 2020-08-26 DIAGNOSIS — K580 Irritable bowel syndrome with diarrhea: Secondary | ICD-10-CM | POA: Insufficient documentation

## 2020-08-26 DIAGNOSIS — K529 Noninfective gastroenteritis and colitis, unspecified: Secondary | ICD-10-CM | POA: Diagnosis not present

## 2020-08-26 MED ORDER — LOPERAMIDE HCL 2 MG PO CAPS: 2.0000 mg | ORAL_CAPSULE | Freq: Two times a day (BID) | ORAL | 5 refills | Status: DC

## 2020-08-26 MED ORDER — PLENVU 140 G PO SOLR: ORAL | 0 refills | Status: DC

## 2020-08-26 NOTE — Patient Instructions (Addendum)
If you are age 49 or older, your body mass index should be between 23-30. Your Body mass index is 30.85 kg/m. If this is out of the aforementioned range listed, please consider follow up with your Primary Care Provider.  If you are age 29 or younger, your body mass index should be between 19-25. Your Body mass index is 30.85 kg/m. If this is out of the aformentioned range listed, please consider follow up with your Primary Care Provider.   We have sent the following medications to your pharmacy for you to pick up at your convenience: Imodium   Your provider has requested that you go to the basement level for lab work before leaving today. Press "B" on the elevator. The lab is located at the first door on the left as you exit the elevator.  You have been scheduled for a colonoscopy. Please follow written instructions given to you at your visit today.  Please pick up your prep supplies at the pharmacy within the next 1-3 days. If you use inhalers (even only as needed), please bring them with you on the day of your procedure.  Thank you for choosing me and [facility].  Doug Sou, PA-C

## 2020-08-26 NOTE — Progress Notes (Signed)
08/26/2020 Heather Marks [id number] Jun 16, 1971   HISTORY OF PRESENT ILLNESS:  This is a 50 year old female who is a patient of Dr. Irving Burton.  She follows here for issues with chronic diarrhea/IBS-D.  She has undergone extensive evaluation in the past including colonoscopy in May 2018 in [place].  Random biopsies that time were negative for microscopic colitis.  She has had celiac testing and EGDs with biopsies, which were unremarkable.  She has tried multiple medication regimens including intermittent response to cholestyramine, intermittent response to WelChol, little or no improvement with antispasmodic, no improvement with previous trial of rifaximin.  She tells me that she also tried Viberzi when she was in [place] and it did not help.  She tells me that Imodium does help, but she is skeptical to be taking it regularly.  She is here today again with complaints of ongoing diarrhea.  She says that she is having anywhere from 7-10 bowel movements daily and they are described as mostly liquid.  A lot of times this occurs right after she eats.  She complains of a lot of noise and gurgling in her stomach/abdomen constantly.  She denies any abdominal pain per se, but just describes pressure in her abdomen prior to bowel movements with some relief afterwards.  She does describe nocturnal bowel movements as well.  She denies any bleeding.  She does have a lot of urgency and is constantly looking for bathrooms/needing to know where they are located, etc. she does take a probiotic.  As of late she had been using WelChol, says sometimes it works and sometimes it does not.  Once again she says that Imodium does work but she was skeptical of taking it every day/regularly.  Past Medical History:  Diagnosis Date  . Anemia   . Anxiety   . Depression   . GERD (gastroesophageal reflux disease)   . Hyperlipidemia   . Hypertension   . IBS (irritable bowel syndrome)   . Tachycardia    Past Surgical  History:  Procedure Laterality Date  . BREAST LUMPECTOMY Bilateral   . BUNIONECTOMY Bilateral   . COLONOSCOPY    . UPPER GASTROINTESTINAL ENDOSCOPY      reports that she has never smoked. She has never used smokeless tobacco. She reports current alcohol use. She reports that she does not use drugs. family history includes Diabetes in her mother; Heart disease in her paternal grandfather; Hypertension in her mother; Other in her maternal grandfather and maternal grandmother; Thyroid disease in her father. Allergies  Allergen Reactions  . Macrodantin [Nitrofurantoin] Swelling    Swelling of lips  . Reglan [Metoclopramide] Other (See Comments)    Makes patient restless      Outpatient Encounter Medications as of 08/26/2020  Medication Sig  . amLODipine (NORVASC) 10 MG tablet Take 10 mg by mouth daily.  . colesevelam (WELCHOL) 625 MG tablet TAKE 2 TABLETS (1,250 MG TOTAL) BY MOUTH 2 (TWO) TIMES DAILY WITH A MEAL.  . hydrochlorothiazide (HYDRODIURIL) 25 MG tablet Take 25 mg by mouth daily as needed.  Marland Kitchen omeprazole (PRILOSEC) 20 MG capsule Take 20 mg by mouth daily.  . Probiotic Product (PROBIOTIC PO) Take 1 capsule by mouth daily. [employer] brand  . sertraline (ZOLOFT) 50 MG tablet Take 50 mg by mouth daily.  . [DISCONTINUED] cholestyramine light (PREVALITE) 4 g packet MIX 1 PACKET WITH LIQUID AND DRINK TWICE A DAY AT LEAST 2 HRS BEFORE OR AFTER ANY OTHER MEDICINES (Patient not taking: Reported on 08/26/2020)  No facility-administered encounter medications on file as of 08/26/2020.    REVIEW OF SYSTEMS  : All other systems reviewed and negative except where noted in the History of Present Illness.   PHYSICAL EXAM: BP (!) 138/92 (BP Location: Right Arm, Patient Position: Sitting, Cuff Size: Normal)   Pulse 76   Ht 5\' 7"  (1.702 m)   Wt 197 lb (89.4 kg)   BMI 30.85 kg/m  General: Well developed AA female in no acute distress Head: Normocephalic and atraumatic Eyes:  Sclerae anicteric,  conjunctiva pink. Ears: Normal auditory acuity Lungs: Clear throughout to auscultation; no W/R/R. Heart: Regular rate and rhythm; no M/R/G. Abdomen: Soft, non-distended.  BS present and hyperactive.  Non-tender. Rectal:  Will be done at the time of colonoscopy. Musculoskeletal: Symmetrical with no gross deformities  Skin: No lesions on visible extremities Extremities: No edema  Neurological: Alert oriented x 4, grossly non-focal Psychological:  Alert and cooperative. Normal mood and affect  ASSESSMENT AND PLAN: *Chronic diarrhea/IBS-D: She has had extensive work-up in the past and has tried multiple medication regimens.  She would like to have a colonoscopy again just to be sure there are no other issues.  Her last was 3-1/2 years ago.  We will schedule with Dr. .  I am also going to check stool studies to rule out infectious source since she describes watery stools multiple times a day for the past few months.  She reports that Imodium does help but she was skeptical on taking it regularly.  I advised that she could certainly do so.  She would like Myrtie Neither to send a prescription so we will send prescription for Imodium for 2 pills daily.  She can titrate it as she needs to.  ? Trial of TCA.  **The risks, benefits, and alternatives to colonoscopy were discussed with the patient and she consents to proceed.   CC:  [place], FNP

## 2020-08-30 LAB — GI PROFILE, STOOL, PCR

## 2020-08-30 NOTE — Progress Notes (Signed)
____________________________________________________________  Attending physician addendum:  Thank you for sending this case to me. I have reviewed the entire note and agree with the plan.  It has been a challenging case because she has not consistently responded to any of the treatments thus far.  Repeat colonoscopy is reasonable, and I will take some biopsies again to rule out microscopic colitis. I will discuss with her a possible trial of low-dose TCA as you suggest.  These tend to be more helpful for abdominal pain than the degree of diarrhea she is having.  However, given how much this is troubling her and the lack of response to all of the treatments, it may be worth a try.  Heather Jupiter, MD  ____________________________________________________________

## 2020-10-09 ENCOUNTER — Encounter: Payer: PRIVATE HEALTH INSURANCE | Admitting: Gastroenterology

## 2020-11-26 ENCOUNTER — Other Ambulatory Visit: Payer: Self-pay

## 2020-11-26 ENCOUNTER — Ambulatory Visit (AMBULATORY_SURGERY_CENTER): Payer: PRIVATE HEALTH INSURANCE | Admitting: *Deleted

## 2020-11-26 VITALS — Ht 67.0 in | Wt 197.0 lb

## 2020-11-26 DIAGNOSIS — K529 Noninfective gastroenteritis and colitis, unspecified: Secondary | ICD-10-CM

## 2020-11-26 MED ORDER — PEG 3350-KCL-NA BICARB-NACL 420 G PO SOLR: 4000.0000 mL | Freq: Once | ORAL | 0 refills | Status: AC

## 2020-11-26 NOTE — Progress Notes (Signed)
Patient's pre-visit was done today over the phone with the patient due to COVID-19 pandemic. Name,DOB and address verified. Insurance verified. Patient denies any allergies to Eggs and Soy. Patient denies any problems with anesthesia/sedation. Patient denies taking diet pills or blood thinners. Packet of Prep instructions mailed to patient including a copy of a consent form-pt is aware.  Patient understands to call us back with any questions or concerns. The patient is COVID-19 fully vaccinated, per patient. Patient is aware of our care-partner policy and Covid-19 safety protocol. Patient denies any medical changes in hx since last GI OV.

## 2020-12-15 ENCOUNTER — Other Ambulatory Visit: Payer: Self-pay

## 2020-12-15 ENCOUNTER — Encounter: Payer: Self-pay | Admitting: Gastroenterology

## 2020-12-15 ENCOUNTER — Ambulatory Visit (AMBULATORY_SURGERY_CENTER): Payer: PRIVATE HEALTH INSURANCE | Admitting: Gastroenterology

## 2020-12-15 ENCOUNTER — Other Ambulatory Visit: Payer: Self-pay | Admitting: Gastroenterology

## 2020-12-15 VITALS — BP 100/70 | HR 71 | Temp 97.5°F | Resp 14 | Ht 67.0 in | Wt 197.0 lb

## 2020-12-15 DIAGNOSIS — K529 Noninfective gastroenteritis and colitis, unspecified: Secondary | ICD-10-CM | POA: Diagnosis present

## 2020-12-15 MED ORDER — SODIUM CHLORIDE 0.9 % IV SOLN: 500.0000 mL | Freq: Once | INTRAVENOUS | Status: AC

## 2020-12-15 NOTE — Patient Instructions (Signed)
Resume previous medications.  Await pathology for final recommendations.  Handouts on findings given to patient.    Return to office at appointment to be scheduled.    YOU HAD AN ENDOSCOPIC PROCEDURE TODAY AT THE [facility]:   Refer to the procedure report that was given to you for any specific questions about what was found during the examination.  If the procedure report does not answer your questions, please call your gastroenterologist to clarify.  If you requested that your care partner not be given the details of your procedure findings, then the procedure report has been included in a sealed envelope for you to review at your convenience later.  YOU SHOULD EXPECT: Some feelings of bloating in the abdomen. Passage of more gas than usual.  Walking can help get rid of the air that was put into your GI tract during the procedure and reduce the bloating. If you had a lower endoscopy (such as a colonoscopy or flexible sigmoidoscopy) you may notice spotting of blood in your stool or on the toilet paper. If you underwent a bowel prep for your procedure, you may not have a normal bowel movement for a few days.  Please Note:  You might notice some irritation and congestion in your nose or some drainage.  This is from the oxygen used during your procedure.  There is no need for concern and it should clear up in a day or so.  SYMPTOMS TO REPORT IMMEDIATELY:   Following lower endoscopy (colonoscopy or flexible sigmoidoscopy):  Excessive amounts of blood in the stool  Significant tenderness or worsening of abdominal pains  Swelling of the abdomen that is new, acute  Fever of 100F or higher   For urgent or emergent issues, a gastroenterologist can be reached at any hour by calling [phone]. Do not use MyChart messaging for urgent concerns.    DIET:  We do recommend a small meal at first, but then you may proceed to your regular diet.  Drink plenty of fluids but you should avoid  alcoholic beverages for 24 hours.  ACTIVITY:  You should plan to take it easy for the rest of today and you should NOT DRIVE or use heavy machinery until tomorrow (because of the sedation medicines used during the test).    FOLLOW UP: Our staff will call the number listed on your records 48-72 hours following your procedure to check on you and address any questions or concerns that you may have regarding the information given to you following your procedure. If we do not reach you, we will leave a message.  We will attempt to reach you two times.  During this call, we will ask if you have developed any symptoms of COVID 19. If you develop any symptoms (ie: fever, flu-like symptoms, shortness of breath, cough etc.) before then, please call [phone].  If you test positive for Covid 19 in the 2 weeks post procedure, please call and report this information to [place].    If any biopsies were taken you will be contacted by phone or by letter within the next 1-3 weeks.  Please call us at [phone] if you have not heard about the biopsies in 3 weeks.    SIGNATURES/CONFIDENTIALITY: You and/or your care partner have signed paperwork which will be entered into your electronic medical record.  These signatures attest to the fact that that the information above on your After Visit Summary has been reviewed and is understood.  Full responsibility of  the confidentiality of this discharge information lies with you and/or your care-partner. 

## 2020-12-15 NOTE — Op Note (Signed)
[facility] Patient Name: Heather Marks Procedure Date: 12/15/2020 8:46 AM MRN: [id number] Endoscopist: Sherilyn Cooter L. Myrtie Neither , MD Age: 50 Referring MD:  Date of Birth: Aug 22, 1970 Gender: Female Account #: [id number] Procedure:                Colonoscopy Indications:              Chronic diarrhea Medicines:                Monitored Anesthesia Care Procedure:                Pre-Anesthesia Assessment:                           - Prior to the procedure, a History and Physical                            was performed, and patient medications and                            allergies were reviewed. The patient's tolerance of                            previous anesthesia was also reviewed. The risks                            and benefits of the procedure and the sedation                            options and risks were discussed with the patient.                            All questions were answered, and informed consent                            was obtained. Prior Anticoagulants: The patient has                            taken no previous anticoagulant or antiplatelet                            agents. ASA Grade Assessment: II - A patient with                            mild systemic disease. After reviewing the risks                            and benefits, the patient was deemed in                            satisfactory condition to undergo the procedure.                           After obtaining informed consent, the colonoscope  was passed under direct vision. Throughout the                            procedure, the patient's blood pressure, pulse, and                            oxygen saturations were monitored continuously. The                            Olympus CF-[id number]) [id number] was introduced                            through the anus and advanced to the the terminal                            ileum, with identification of the  appendiceal                            orifice and IC valve. The colonoscopy was performed                            without difficulty. The patient tolerated the                            procedure well. The quality of the bowel                            preparation was excellent. The terminal ileum,                            ileocecal valve, appendiceal orifice, and rectum                            were photographed. Scope In: 9:01:56 AM Scope Out: 9:18:13 AM Scope Withdrawal Time: 0 hours 11 minutes 52 seconds  Total Procedure Duration: 0 hours 16 minutes 17 seconds  Findings:                 The perianal and digital rectal examinations were                            normal.                           The terminal ileum appeared normal. (deeply                            intubated)                           Normal mucosa was found in the entire colon.                            Biopsies for histology were taken with a cold  forceps from the ascending colon, transverse colon                            and descending colon for evaluation of microscopic                            colitis.                           The entire examined colon appeared normal on direct                            and retroflexion views. Complications:            No immediate complications. Estimated Blood Loss:     Estimated blood loss was minimal. Impression:               - The examined portion of the ileum was normal.                           - Normal mucosa in the entire examined colon.                            Biopsied.                           - The entire examined colon is normal on direct and                            retroflexion views. Recommendation:           - Patient has a contact number available for                            emergencies. The signs and symptoms of potential                            delayed complications were discussed with the                             patient. Return to normal activities tomorrow.                            Written discharge instructions were provided to the                            patient.                           - Resume previous diet.                           - Continue present medications.                           - Await pathology results.                           -  Repeat colonoscopy in 10 years for screening                            purposes.                           - Return to my office at appointment to be                            scheduled. Itai Barbian L. Myrtie Neitheranis, MD 12/15/2020 9:21:55 AM This report has been signed electronically.

## 2020-12-15 NOTE — Progress Notes (Signed)
To PACU, VSS. Report to RN.tb 

## 2020-12-15 NOTE — Progress Notes (Signed)
Pt. Reports no change in her medical or surgical history since her virtual pre-visit 11/26/2020.

## 2020-12-16 ENCOUNTER — Telehealth: Payer: Self-pay

## 2020-12-16 NOTE — Telephone Encounter (Signed)
Per 12/15/20 procedure note - Return to my office at appointment to be scheduled.   Spoke with patient and she has been scheduled for a follow up on Tuesday, 01/19/21 at 8:20 AM.

## 2020-12-17 ENCOUNTER — Telehealth: Payer: Self-pay | Admitting: *Deleted

## 2020-12-17 NOTE — Telephone Encounter (Signed)
  Follow up Call-  Call back number 12/15/2020 10/23/2019  Post procedure Call Back phone  # [phone]  Permission to leave phone message Yes Yes  Some recent data might be hidden     Patient questions:  Do you have a fever, pain , or abdominal swelling? No. Pain Score  0 *  Have you tolerated food without any problems? Yes.    Have you been able to return to your normal activities? Yes.    Do you have any questions about your discharge instructions: Diet   No. Medications  No. Follow up visit  No.  Do you have questions or concerns about your Care? No.  Actions: * If pain score is 4 or above: No action needed, pain <4.  1. Have you developed a fever since your procedure? no  2.   Have you had an respiratory symptoms (SOB or cough) since your procedure? no  3.   Have you tested positive for COVID 19 since your procedure no  4.   Have you had any family members/close contacts diagnosed with the COVID 19 since your procedure?  no   If yes to any of these questions please route to Laverna Peace, RN and Karlton Lemon, RN

## 2020-12-30 ENCOUNTER — Telehealth: Payer: Self-pay | Admitting: Gastroenterology

## 2020-12-30 NOTE — Telephone Encounter (Signed)
Noted, thanks!

## 2020-12-30 NOTE — Telephone Encounter (Signed)
Patient returned the call and was advised of Dr. Irving Burton recommendations. Patient understood and had no questions at this time.

## 2021-01-19 ENCOUNTER — Ambulatory Visit (INDEPENDENT_AMBULATORY_CARE_PROVIDER_SITE_OTHER): Payer: PRIVATE HEALTH INSURANCE | Admitting: Gastroenterology

## 2021-01-19 ENCOUNTER — Encounter: Payer: Self-pay | Admitting: Gastroenterology

## 2021-01-19 VITALS — BP 94/70 | HR 68 | Ht 66.5 in | Wt 187.2 lb

## 2021-01-19 DIAGNOSIS — K580 Irritable bowel syndrome with diarrhea: Secondary | ICD-10-CM

## 2021-01-19 DIAGNOSIS — K529 Noninfective gastroenteritis and colitis, unspecified: Secondary | ICD-10-CM | POA: Diagnosis not present

## 2021-01-19 DIAGNOSIS — R140 Abdominal distension (gaseous): Secondary | ICD-10-CM

## 2021-01-19 NOTE — Patient Instructions (Addendum)
If you are age 49 or older, your body mass index should be between 23-30. Your Body mass index is 29.77 kg/m. If this is out of the aforementioned range listed, please consider follow up with your Primary Care Provider.  If you are age 30 or younger, your body mass index should be between 19-25. Your Body mass index is 29.77 kg/m. If this is out of the aformentioned range listed, please consider follow up with your Primary Care Provider.   __________________________________________________________  The [facility] providers would like to encourage you to use [facility] to communicate with providers for non-urgent requests or questions.  Due to long hold times on the telephone, sending your provider a message by [facility] may be a faster and more efficient way to get a response.  Please allow 48 business hours for a response.  Please remember that this is for non-urgent requests.   Due to recent changes in healthcare laws, you may see the results of your imaging and laboratory studies on MyChart before your provider has had a chance to review them.  We understand that in some cases there may be results that are confusing or concerning to you. Not all laboratory results come back in the same time frame and the provider may be waiting for multiple results in order to interpret others.  Please give [place] 48 hours in order for your provider to thoroughly review all the results before contacting the office for clarification of your results.   You have been scheduled for a CT enterography of the abdomen and pelvis at [facility], 1st floor Radiology. You are scheduled on 01-26-2021  at 12:30pm. You must arrive at 11:00am to drink the contrast and for registration.  Please follow the written instructions below on the day of your exam:   You may take any medications as prescribed with a small amount of water, if necessary. If you take any of the following medications: METFORMIN, GLUCOPHAGE, [place],  AVANDAMET, RIOMET, FORTAMET, Ava MET, JANUMET, GLUMETZA or METAGLIP, you MAY be asked to HOLD this medication 48 hours AFTER the exam.   If you have any questions regarding your exam or if you need to reschedule, you may call [facility] at [phone] between the hours of 8:00 am and 5:00 pm, Monday-Friday.  Your provider has requested that you go to the basement level for lab work before leaving today. Press "B" on the elevator. The lab is located at the first door on the left as you exit the elevator.  It was a pleasure to see you today!  Thank you for trusting me with your gastrointestinal care!         ________________________________________________________________________

## 2021-01-19 NOTE — Progress Notes (Signed)
[facility] Progress Note  Chief Complaint: IBS-D  Subjective  History: Heather Marks follows up for chronic abdominal pain, bloating and diarrhea.  Symptoms similar to before, previous extensive work-up in [place], and has been seen here in last few years.  Little or no response to multiple medicines tried, outlined in prior notes including most recent clinic note from January 19. Colonoscopy May 10 normal to terminal ileum (which was deeply intubated), biopsies negative for microscopic colitis.  Heather Marks feels about the same as before, with at least half a dozen loose bowel movements with urgency and gas every day if she does not take medicines.  Imodium did not work very well, but 2 Lomotil tablets every morning gets her through until the evening, where she might have a semiformed bowel movement.  By the following morning she will have loose BMs again if she does not take the Lomotil.  There is bloating and some crampy bandlike lower pain before bowel movements, but still no focal pain or anything sounding obstructive.  ROS: Cardiovascular:  no chest pain Respiratory: no dyspnea  The patient's Past Medical, Family and Social History were reviewed and are on file in the EMR.  Objective:  Med list reviewed  Current Outpatient Medications:    amLODipine (NORVASC) 10 MG tablet, Take 10 mg by mouth daily., Disp: , Rfl:    hydrochlorothiazide (HYDRODIURIL) 25 MG tablet, Take 25 mg by mouth daily as needed., Disp: , Rfl:    loperamide (IMODIUM A-D) 2 MG capsule, Take 1 capsule (2 mg total) by mouth in the morning and at bedtime., Disp: 60 capsule, Rfl: 5   omeprazole (PRILOSEC) 20 MG capsule, Take 20 mg by mouth daily., Disp: , Rfl:    Probiotic Product (PROBIOTIC PO), Take 1 capsule by mouth daily. [employer] brand, Disp: , Rfl:    sertraline (ZOLOFT) 50 MG tablet, Take 50 mg by mouth daily., Disp: , Rfl:   Current Facility-Administered Medications:    0.9 %  sodium chloride infusion,  500 mL, Intravenous, Once, Danis, Starr Lake III, MD   Vital signs in last 24 hrs: Vitals:   01/19/21 0810  BP: 94/70  Pulse: 68   Wt Readings from Last 3 Encounters:  01/19/21 187 lb 4 oz (84.9 kg)  12/15/20 197 lb (89.4 kg)  11/26/20 197 lb (89.4 kg)    Physical Exam  Well-appearing HEENT: sclera anicteric, oral mucosa moist without lesions Neck: supple, no thyromegaly, JVD or lymphadenopathy Cardiac: RRR without murmurs, S1S2 heard, no peripheral edema Pulm: clear to auscultation bilaterally, normal RR and effort noted Abdomen: soft, no tenderness, with active bowel sounds. No guarding or palpable hepatosplenomegaly. Skin; warm and dry, no jaundice or rash  Labs:   ___________________________________________ Radiologic studies:   ____________________________________________ Other:  Recent colon biopsies negative for microscopic colitis _____________________________________________ Assessment & Plan  Assessment: Encounter Diagnoses  Name Primary?   Irritable bowel syndrome with diarrhea Yes   Chronic diarrhea    Abdominal bloating    IBS-D is still most likely her diagnosis, more proximal small bowel Crohn's disease possible but uncommon, and pain is not her predominant symptom.  My recommendations to her were as follows:  Continue Lomotil 2 tablets every morning, and I will refill as needed.  Fortunately, she does not seem to be having side effects from it  CT enterography when the current shortage of IV contrast is over.  This would be to rule out more proximal small bowel Crohn's disease.  If there is something  suggestive of that on CT scan, then a video capsule study would most likely be needed.  We had an initial discussion about possible use of Lotronex, which I think she would be a good candidate for if the CT scan is unrevealing.  I discussed the history of this medicine and the risk of ischemic colitis as the reason it was taken off the market for some  time.  I discussed the current restrictions on its use and the need to give her reading material about it, which we did.  She was glad to learn that, plan to review those materials and consider using that medication.   32 minutes were spent on this encounter (including chart review, history/exam, counseling/coordination of care, and documentation) > 50% of that time was spent on counseling and coordination of care.  Topics discussed included: See above.  Charlie Pitter III

## 2021-01-26 ENCOUNTER — Ambulatory Visit (HOSPITAL_COMMUNITY): Payer: PRIVATE HEALTH INSURANCE

## 2021-09-14 ENCOUNTER — Other Ambulatory Visit: Payer: Self-pay | Admitting: Gastroenterology

## 2022-02-11 IMAGING — US US ABDOMEN LIMITED
1 series · 14 of 25 positions shown · non-contrast
Comparison: None.

CLINICAL DATA: Right upper quadrant pain, bloating, diarrhea

EXAM:
ULTRASOUND ABDOMEN LIMITED RIGHT UPPER QUADRANT

[Series 1: us abdomen limited · 14 of 62 slices shown]
[im 1/62]
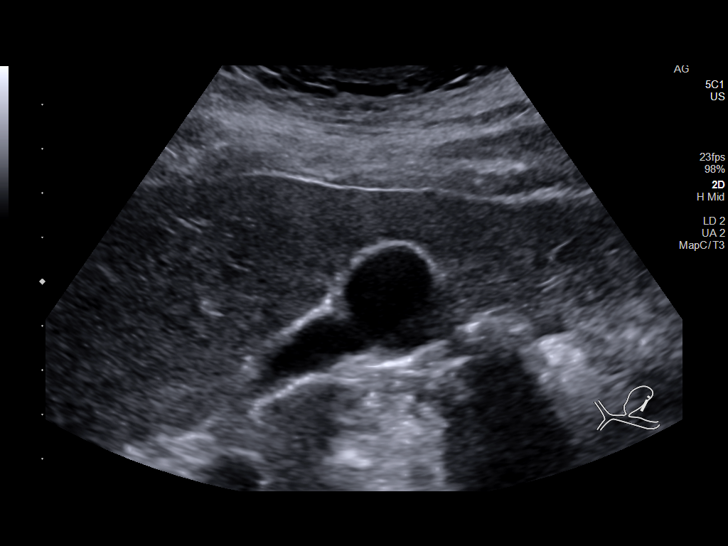
[im 6/62]
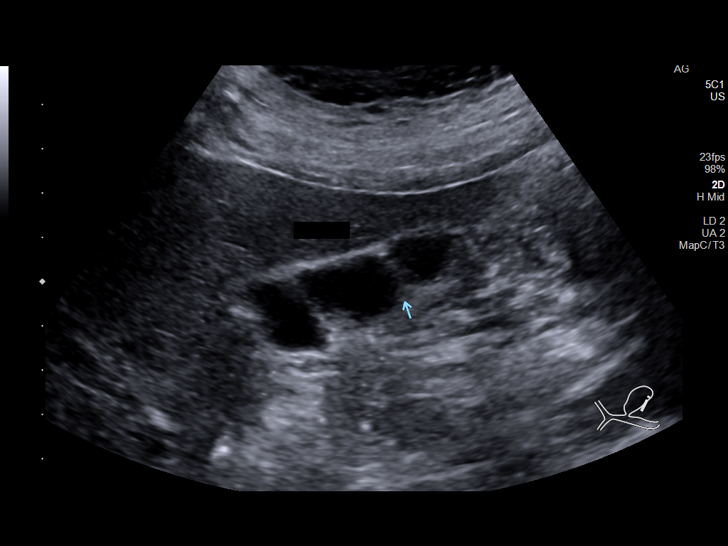
[im 11/62]
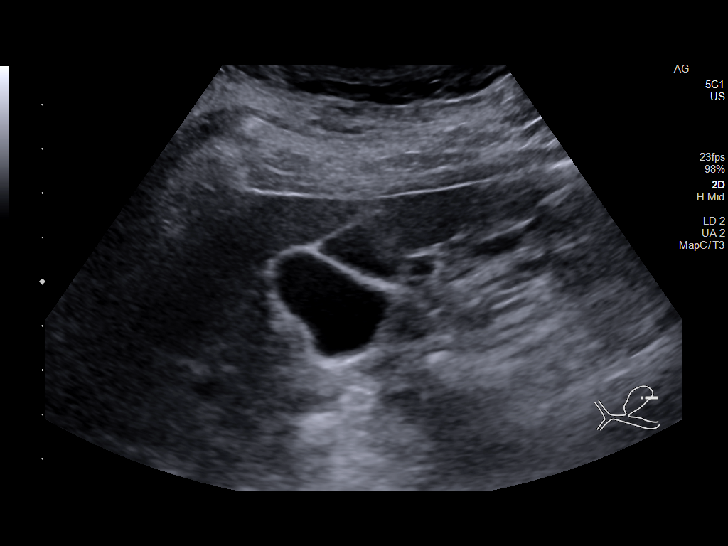
[im 16/62]
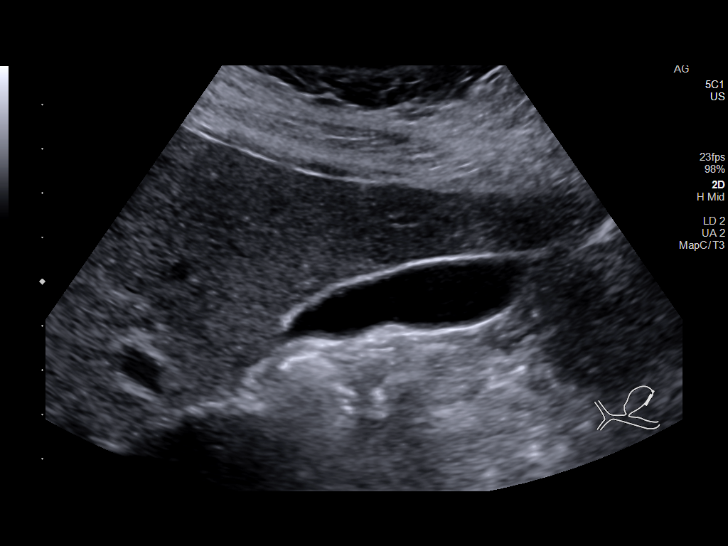
[im 21/62]
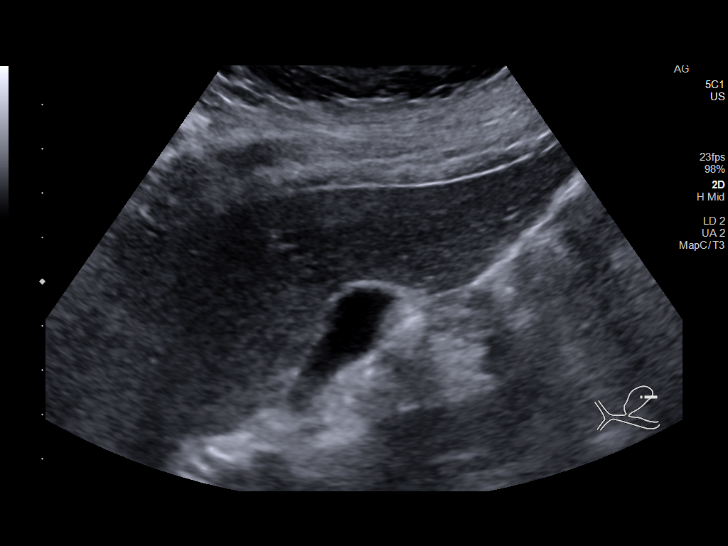
[im 23/62]
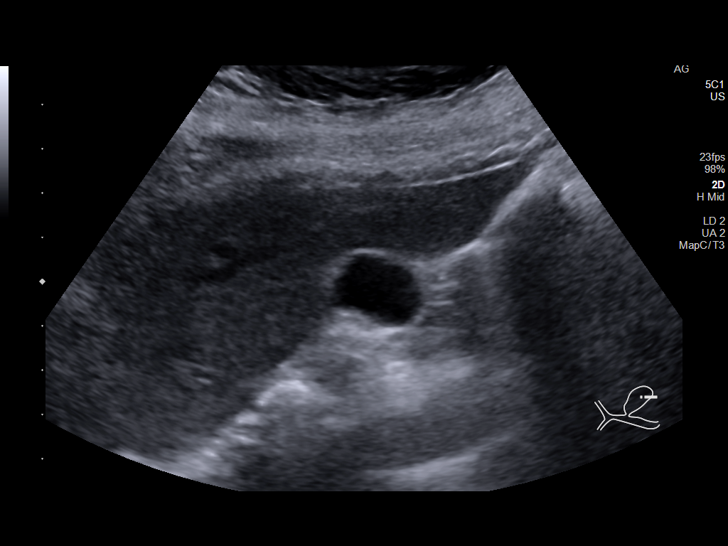
[im 28/62]
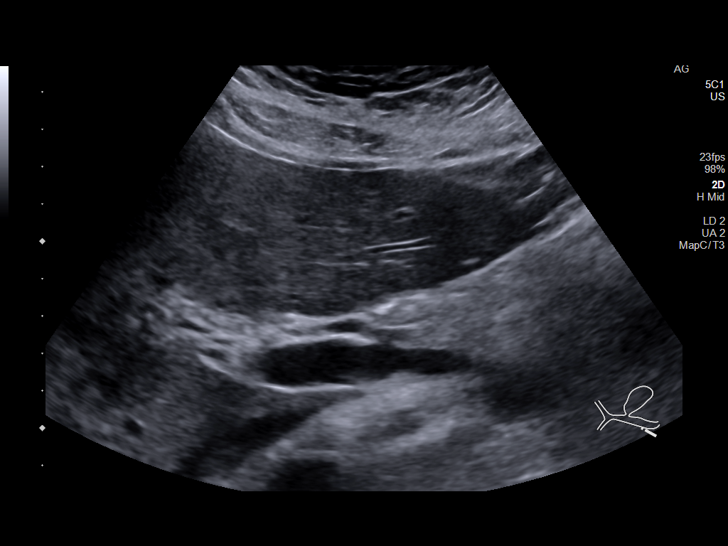
[im 34/62]
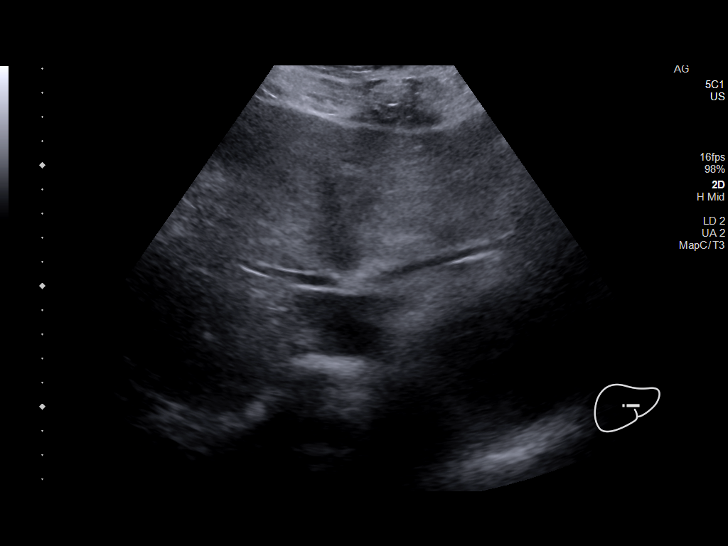
[im 39/62]
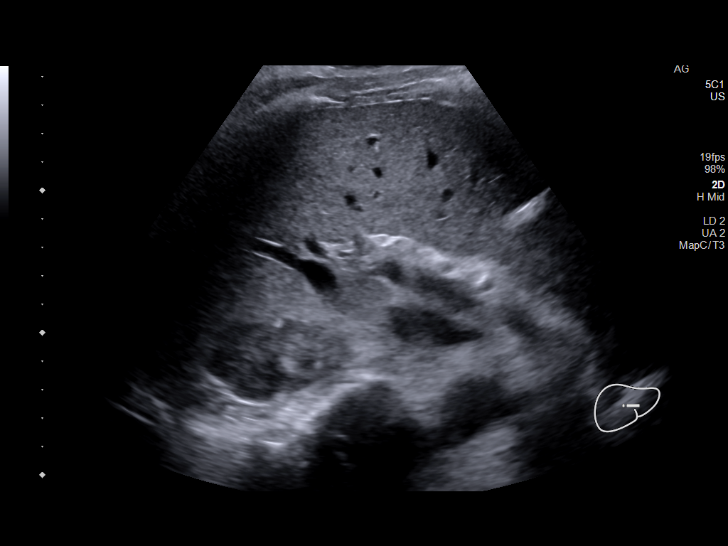
[im 41/62]
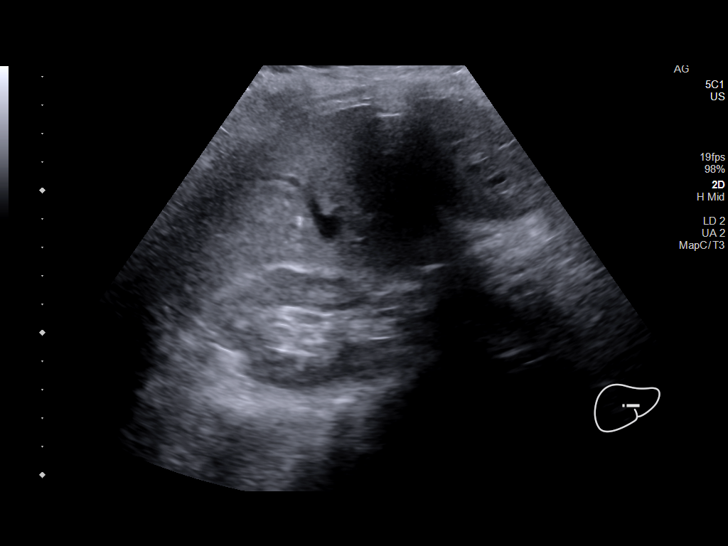
[im 46/62]
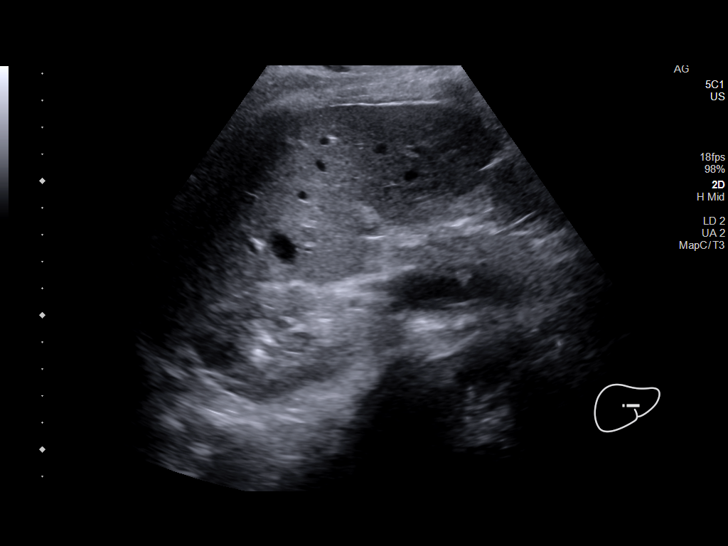
[im 51/62]
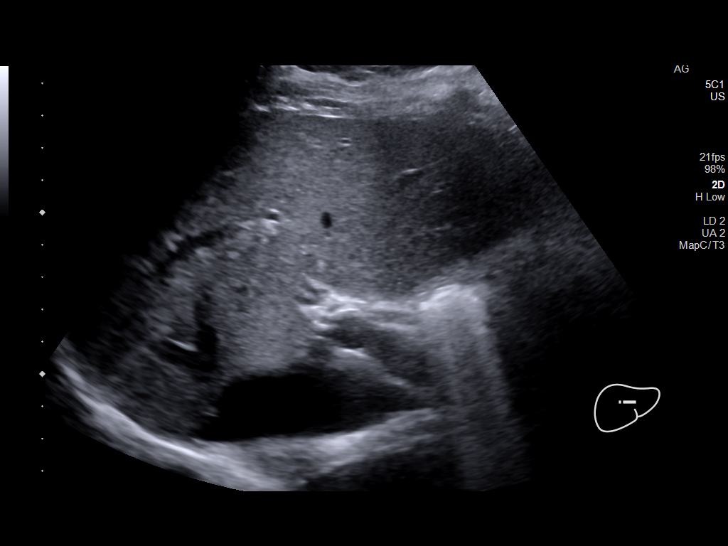
[im 56/62]
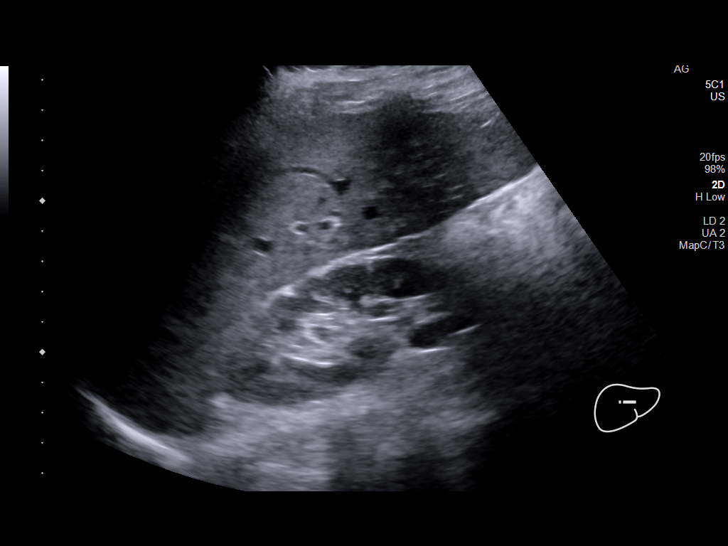
[im 62/62]
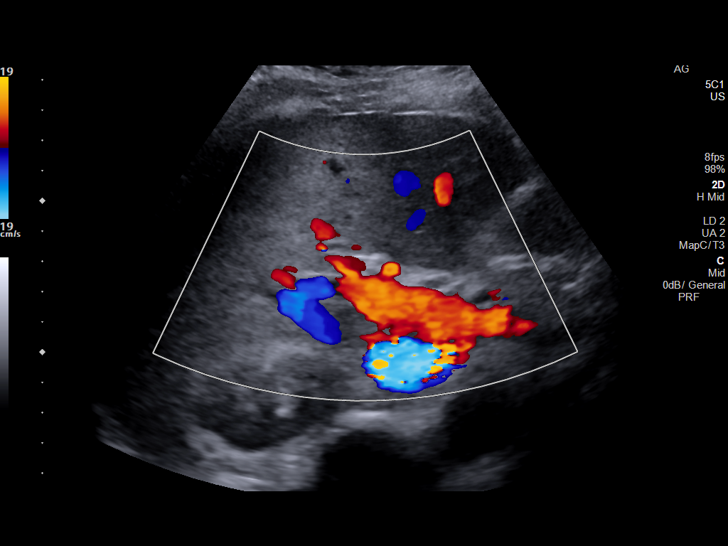

[14 of 25 positions shown; findings below may reference images not displayed]

FINDINGS: Gallbladder:

No gallstones or wall thickening visualized. No sonographic Murphy
sign noted by sonographer.

Common bile duct:

Diameter: 3.1 mm

Liver:

Minimal increased echogenicity suggesting early hepatic steatosis.
No focal abnormality or intrahepatic biliary dilatation. Portal vein
is patent on color Doppler imaging with normal direction of blood
flow towards the liver.

Other: No free fluid or ascites
IMPRESSION: Negative for gallstones or other acute finding by ultrasound.

Suspect minor/early hepatic steatosis.

## 2022-05-02 ENCOUNTER — Telehealth: Payer: Self-pay | Admitting: Gastroenterology

## 2022-05-02 NOTE — Telephone Encounter (Signed)
Returned call to patient. She states that she has developed hemorrhoids and they are now bleeding. Pt reports history of IBD, she takes Imodium daily to help control loose stools. I provided patient with hemorrhoid treatment recommendations as outlined below: -limit commode time -use wet wipes instead of toilet paper -pat the area dry do not vigorously rub the area -use preparation H suppositories over the counter as needed  -if having rectal discomfort or pain use recticare cream over the counter -drink plenty of fluids or water  Patient also scheduled for an appt with Nicoletta Ba, PA-C on Friday, 05/20/22 at 1:30 pm. Pt asked if she could have a work note as well so that she can be excused to use the restroom urgently when she has IBS symptoms. 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 I told pt that after her office visit they can provide her with a work note. Pt verbalized understanding and had no concerns at the end of the call.

## 2022-05-02 NOTE — Telephone Encounter (Signed)
Inbound call from patient states she is experiencing hemorrhoids and they are bleeding. She made an appt with Dr. Loletha Carrow 11/14 but states she cant wait that long. Please advise.

## 2022-05-20 ENCOUNTER — Ambulatory Visit (INDEPENDENT_AMBULATORY_CARE_PROVIDER_SITE_OTHER): Payer: BC Managed Care – PPO | Admitting: Physician Assistant

## 2022-05-20 ENCOUNTER — Encounter: Payer: Self-pay | Admitting: Physician Assistant

## 2022-05-20 VITALS — BP 100/70 | HR 63 | Ht 66.5 in | Wt 187.0 lb

## 2022-05-20 DIAGNOSIS — K649 Unspecified hemorrhoids: Secondary | ICD-10-CM | POA: Diagnosis not present

## 2022-05-20 DIAGNOSIS — Z0279 Encounter for issue of other medical certificate: Secondary | ICD-10-CM

## 2022-05-20 NOTE — Progress Notes (Signed)
Subjective:    Patient ID: Heather Marks, female    DOB: 1971-02-25, 51 y.o.   MRN: [id number]  HPI Heather Marks is a pleasant 51 year old female, established with Dr. Myrtie Neither.  She was last seen in June 2022, she comes in today after an episode of rectal bleeding. She had undergone colonoscopy in May 2022 for complaints of chronic diarrhea type symptoms.  This was a normal exam including intubation of the terminal ileum and random biopsies were negative.  On retroflex no evidence of internal hemorrhoids. She has used Imodium as needed. She says she had an episode of diarrhea last month, and with increased frequency of bowel movements started noticing some bright red blood which was primarily on the tissue but did notice a small amount in the commode separate from the stool.  Along with that she had some burning anal rectal discomfort which she felt was just inside the anus.  The symptoms lasted for about 3 days and have since resolved.  She had called here and was told to try Preparation H suppositories which she used for a couple of days. No associated abdominal pain, and rectal symptoms have completely resolved at this time.  Review of Systems Pertinent positive and negative review of systems were noted in the above HPI section.  All other review of systems was otherwise negative.   Outpatient Encounter Medications as of 05/20/2022  Medication Sig   amLODipine (NORVASC) 10 MG tablet Take 10 mg by mouth daily.   hydrochlorothiazide (HYDRODIURIL) 25 MG tablet Take 25 mg by mouth daily as needed.   loperamide (IMODIUM) 2 MG capsule TAKE 1 CAPSULE BY MOUTH EVERY MORNING AND AT BEDTIME   omeprazole (PRILOSEC) 20 MG capsule Take 20 mg by mouth daily.   Probiotic Product (PROBIOTIC PO) Take 1 capsule by mouth daily. [employer] brand   sertraline (ZOLOFT) 50 MG tablet Take 50 mg by mouth daily.   Facility-Administered Encounter Medications as of 05/20/2022  Medication   0.9 %  sodium chloride infusion    Allergies  Allergen Reactions   Macrodantin [Nitrofurantoin] Swelling    Swelling of lips   Reglan [Metoclopramide] Other (See Comments)    Makes patient restless   Patient Active Problem List   Diagnosis Date Noted   Chronic diarrhea 08/26/2020   Irritable bowel syndrome with diarrhea 08/26/2020   Social History   Socioeconomic History   Marital status: Single    Spouse name: Not on file   Number of children: 1   Years of education: Not on file   Highest education level: Not on file  Occupational History   Occupation: Charity fundraiser    Comment: [employer]  Tobacco Use   Smoking status: Never   Smokeless tobacco: Never  Vaping Use   Vaping Use: Never used  Substance and Sexual Activity   Alcohol use: Yes    Comment: socially   Drug use: No   Sexual activity: Not on file  Other Topics Concern   Not on file  Social History Narrative   Not on file   Social Determinants of Health   Financial Resource Strain: Not on file  Food Insecurity: Not on file  Transportation Needs: Not on file  Physical Activity: Not on file  Stress: Not on file  Social Connections: Not on file  Intimate Partner Violence: Not on file    Heather Marks family history includes Diabetes in her mother; Heart disease in her paternal grandfather; Hypertension in her mother; Other in her maternal grandfather  and maternal grandmother; Thyroid disease in her father.      Objective:    Vitals:   05/20/22 1336  BP: 100/70  Pulse: 63  SpO2: 97%     Physical Exam Well-developed well-nourished African-American female in no acute distress.  Height, Weight, 187 BMI 29.7  HEENT; nontraumatic normocephalic, EOMI, PE R LA, sclera anicteric.  Rectal; mildly inflamed external hemorrhoid, friable on anoscopy, nonthrombosed, nontender Skin; benign exam, no jaundice rash or appreciable lesions Extremities; no clubbing cyanosis or edema skin warm and dry Neuro/Psych; alert and  oriented x4, grossly nonfocal mood and affect appropriate        Assessment & Plan:   #21 51 year old female with recent episode of small-volume hematochezia associated with an episode of diarrhea.  Patient had anal rectal burning discomfort.  Symptoms have resolved over the past couple of weeks, no recurrence Exam is consistent with a friable mildly inflamed external hemorrhoid as source for symptoms.  #2 colon cancer screening-negative colonoscopy May 2022 done for diarrheal symptoms, random biopsies negative  Plan; reassured that bleeding was secondary to a hemorrhoid. We discussed use of Preparation H suppositories for at least 5 days sequentially at bedtime should she have recurrence of symptoms. For any external symptoms she can use RectiCare complete 3-4 times daily. She will follow-up with Dr. Loletha Carrow or myself as needed.   Yovani Cogburn S Dequann Vandervelden PA-C 05/20/2022   Cc: Frances Maywood, FNP

## 2022-05-20 NOTE — Patient Instructions (Addendum)
Please follow up with Dr Loletha Carrow if you have recurrent rectal bleeding. ____________________________________________________  Please purchase the following medications over the counter and take as directed: Preparation H Suppositories-1 per rectum every night x 5-7 days if recurrent bleeding. _______________________________________________________  If you have recurrent external bleeding, use Recticare complete 3-4 times daily (over the counter) _______________________________________________________  If you are age 50 or older, your body mass index should be between 23-30. Your Body mass index is 29.73 kg/m. If this is out of the aforementioned range listed, please consider follow up with your Primary Care Provider.  If you are age 15 or younger, your body mass index should be between 19-25. Your Body mass index is 29.73 kg/m. If this is out of the aformentioned range listed, please consider follow up with your Primary Care Provider.   ________________________________________________________  The [facility] providers would like to encourage you to use [facility] to communicate with providers for non-urgent requests or questions.  Due to long hold times on the telephone, sending your provider a message by [facility] may be a faster and more efficient way to get a response.  Please allow 48 business hours for a response.  Please remember that this is for non-urgent requests.  _______________________________________________________ Due to recent changes in healthcare laws, you may see the results of your imaging and laboratory studies on MyChart before your provider has had a chance to review them.  We understand that in some cases there may be results that are confusing or concerning to you. Not all laboratory results come back in the same time frame and the provider may be waiting for multiple results in order to interpret others.  Please give [place] 48 hours in order for your provider to thoroughly review  all the results before contacting the office for clarification of your results.

## 2022-05-22 NOTE — Progress Notes (Signed)
____________________________________________________________  Attending physician addendum:  Thank you for sending this case to me. I have reviewed the entire note and agree with the plan.  Agree this sounds like benign anal rectal bleeding. I have seen her for IBS D, most recently in June 2022.  When she follows up with me, we will revisit our discussion of possible Lotronex for that condition.  Wilfrid Lund, MD  ____________________________________________________________

## 2022-05-26 ENCOUNTER — Telehealth: Payer: Self-pay | Admitting: Physician Assistant

## 2022-05-26 NOTE — Telephone Encounter (Signed)
Patient called states she needs a note for her  for 9-24 and 9-25. States she did not go to work due to her bleeding . States she will pick it up.

## 2022-05-30 ENCOUNTER — Other Ambulatory Visit: Payer: Self-pay

## 2022-05-30 NOTE — Telephone Encounter (Signed)
Letter available for printing dated 05/30/22.

## 2022-06-21 ENCOUNTER — Encounter: Payer: Self-pay | Admitting: Gastroenterology

## 2022-06-21 ENCOUNTER — Ambulatory Visit (INDEPENDENT_AMBULATORY_CARE_PROVIDER_SITE_OTHER): Payer: PRIVATE HEALTH INSURANCE | Admitting: Gastroenterology

## 2022-06-21 ENCOUNTER — Telehealth: Payer: Self-pay | Admitting: Pharmacy Technician

## 2022-06-21 ENCOUNTER — Other Ambulatory Visit (HOSPITAL_COMMUNITY): Payer: Self-pay

## 2022-06-21 VITALS — BP 116/75 | HR 64 | Ht 67.0 in | Wt 194.0 lb

## 2022-06-21 DIAGNOSIS — K580 Irritable bowel syndrome with diarrhea: Secondary | ICD-10-CM

## 2022-06-21 DIAGNOSIS — K648 Other hemorrhoids: Secondary | ICD-10-CM

## 2022-06-21 MED ORDER — ALOSETRON HCL 0.5 MG PO TABS: 0.5000 mg | ORAL_TABLET | Freq: Two times a day (BID) | ORAL | 0 refills | Status: AC

## 2022-06-21 NOTE — Patient Instructions (Signed)
_______________________________________________________  If you are age 50 or older, your body mass index should be between 23-30. Your Body mass index is 30.38 kg/m. If this is out of the aforementioned range listed, please consider follow up with your Primary Care Provider.  If you are age 34 or younger, your body mass index should be between 19-25. Your Body mass index is 30.38 kg/m. If this is out of the aformentioned range listed, please consider follow up with your Primary Care Provider.   ________________________________________________________  The [place] providers would like to encourage you to use [facility] to communicate with providers for non-urgent requests or questions.  Due to long hold times on the telephone, sending your provider a message by [facility] may be a faster and more efficient way to get a response.  Please allow 48 business hours for a response.  Please remember that this is for non-urgent requests.  _______________________________________________________  It was a pleasure to see you today!  Thank you for trusting me with your gastrointestinal care!

## 2022-06-21 NOTE — Progress Notes (Addendum)
[facility] Progress Note  Chief Complaint: IBS and hemorrhoidal bleeding  Subjective  History: Heather Marks was seen by our APP in the office 05/20/2022 for hemorrhoidal bleeding. I last saw her in the office June 2022 for her IBS-D, and I recommended she consider trying Lotronex.  Heather Marks continues to struggle with abdominal bloating and urgent loose watery stools that are unpredictable.  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  She sent FMLA paperwork sometime last month which I completed, though she says her supervisors gave it back to her saying that it was "too vague". She has intermittent bleeding on days when the diarrhea is severe.  Appetite generally good, no weight loss.  My previous office notes detail her prior work-up and multiple medicines tried for this condition.  ROS: Cardiovascular:  no chest pain Respiratory: no dyspnea  The patient's Past Medical, Family and Social History were reviewed and are on file in the EMR.  Objective:  Med list reviewed  Current Outpatient Medications:    amLODipine (NORVASC) 10 MG tablet, Take 10 mg by mouth daily., Disp: , Rfl:    hydrochlorothiazide (HYDRODIURIL) 25 MG tablet, Take 25 mg by mouth daily as needed., Disp: , Rfl:    loperamide (IMODIUM) 2 MG capsule, TAKE 1 CAPSULE BY MOUTH EVERY MORNING AND AT BEDTIME, Disp: 60 capsule, Rfl: 5   omeprazole (PRILOSEC) 20 MG capsule, Take 20 mg by mouth daily., Disp: , Rfl:    Probiotic Product (PROBIOTIC PO), Take 1 capsule by mouth daily. [employer] brand, Disp: , Rfl:    sertraline (ZOLOFT) 50 MG tablet, Take 50 mg by mouth daily., Disp: , Rfl:   Current Facility-Administered Medications:    0.9 %  sodium chloride infusion, 500 mL, Intravenous, Once, Danis, Starr Lake III, MD   Vital signs in last 24 hrs: Vitals:   06/21/22 1519  BP: 116/75  Pulse: 64   Wt Readings from Last 3 Encounters:  06/21/22 194 lb (88  kg)  05/20/22 187 lb (84.8 kg)  01/19/21 187 lb 4 oz (84.9 kg)    Physical Exam [name], MA present for entire exam. Well-appearing  Cardiac: Regular without murmur,  no peripheral edema Pulm: clear to auscultation bilaterally, normal RR and effort noted Abdomen: soft, no tenderness, with active bowel sounds. No guarding or palpable hepatosplenomegaly. DRE normal.  Anoscopy with internal hemorrhoids, right greater than left  Labs:   ___________________________________________ Radiologic studies:   ____________________________________________ Other:   _____________________________________________ Assessment & Plan  Assessment: Encounter Diagnoses  Name Primary?   Irritable bowel syndrome with diarrhea Yes   Bleeding internal hemorrhoids    Severe IBS-D, failed multiple prior medicines.  She is willing to reconsider Lotronex.  I discussed this medicine with her in detail including its history of having been taken off the market and later reintroduced with restrictions.  There is a risk of ischemic colitis which I reviewed with her,, and she has no other apparent risk factors for that condition.  I think it is worth trying the medication and we will be cautious with the dosing and monitoring.  She reviewed the required reading material today and signed the medication contract indicating she reviewed the materials and was aware of the risks. I will started at the lower dose of 0.5 mg twice daily, increasing if needed.  I asked Gaby to contact our office within 2 weeks for an update on symptoms and  how the medicine is working, or sooner if needed.  If we can get the IBS under better control, her hemorrhoids will likely be less symptomatic and perhaps not need banding or other treatment.   30 minutes were spent on this encounter (including chart review, history/exam, counseling/coordination of care, and documentation) > 50% of that time was spent on counseling and coordination  of care.   Charlie Pitter III

## 2022-06-21 NOTE — Telephone Encounter (Signed)
Patient Advocate Encounter  Received notification from [employer] that prior authorization for ALOSETRON 0.5MG  is required.   PA submitted on 11.14.23 Key [id number] Status is pending    Ricke Hey, CPhT Patient Advocate Phone: [phone]

## 2022-06-28 ENCOUNTER — Other Ambulatory Visit (HOSPITAL_COMMUNITY): Payer: Self-pay

## 2022-06-29 NOTE — Telephone Encounter (Signed)
Pharmacy Patient Advocate Encounter  Received notification from [employer] that the request for prior authorization for Alosetron HCL .5 mg has been denied due to .     This determination is currently being reviewed for appeal.    Specialty Pharmacy Patient Advocate Fax:  [phone]

## 2022-07-04 ENCOUNTER — Encounter: Payer: Self-pay | Admitting: Gastroenterology

## 2022-07-06 NOTE — Telephone Encounter (Signed)
Inbound call from [facility] with [employer] with a call back number at [phone]. Please advise.

## 2022-07-07 NOTE — Telephone Encounter (Signed)
Heather Marks from [place] is calling states the the expedited appeal Dr Myrtie Neither sent has been de-expedited and a response will be sent within 30 days.

## 2022-11-26 ENCOUNTER — Other Ambulatory Visit: Payer: Self-pay | Admitting: Gastroenterology

## 2024-09-25 ENCOUNTER — Ambulatory Visit: Admitting: Gastroenterology
